# Patient Record
Sex: Female | Born: 1960 | Race: White | Hispanic: No | Marital: Single | Smoking: Never smoker
Health system: Southern US, Community
[De-identification: ages and names within clinical notes are randomized; demographics above are authoritative.]

## PROBLEM LIST (undated history)

## (undated) DIAGNOSIS — E669 Obesity, unspecified: Secondary | ICD-10-CM

## (undated) DIAGNOSIS — E785 Hyperlipidemia, unspecified: Secondary | ICD-10-CM

## (undated) DIAGNOSIS — I10 Essential (primary) hypertension: Secondary | ICD-10-CM

## (undated) DIAGNOSIS — I119 Hypertensive heart disease without heart failure: Secondary | ICD-10-CM

## (undated) DIAGNOSIS — I5032 Chronic diastolic (congestive) heart failure: Secondary | ICD-10-CM

## (undated) HISTORY — PX: HAND SURGERY: SHX662

## (undated) HISTORY — DX: Obesity, unspecified: E66.9

## (undated) HISTORY — DX: Chronic diastolic (congestive) heart failure: I50.32

## (undated) HISTORY — DX: Hypertensive heart disease without heart failure: I11.9

## (undated) HISTORY — DX: Hyperlipidemia, unspecified: E78.5

---

## 2005-10-20 ENCOUNTER — Ambulatory Visit: Payer: Self-pay | Admitting: Family Medicine

## 2005-12-24 ENCOUNTER — Observation Stay (HOSPITAL_COMMUNITY): Admission: EM | Admit: 2005-12-24 | Discharge: 2005-12-25 | Payer: Self-pay | Admitting: Emergency Medicine

## 2005-12-24 ENCOUNTER — Ambulatory Visit: Payer: Self-pay | Admitting: Cardiovascular Disease

## 2006-01-06 ENCOUNTER — Ambulatory Visit: Payer: Self-pay

## 2007-09-14 ENCOUNTER — Emergency Department: Payer: Self-pay | Admitting: Emergency Medicine

## 2008-01-19 ENCOUNTER — Ambulatory Visit: Payer: Self-pay | Admitting: Family Medicine

## 2009-01-23 ENCOUNTER — Ambulatory Visit: Payer: Self-pay | Admitting: Family Medicine

## 2009-03-20 ENCOUNTER — Ambulatory Visit: Payer: Self-pay | Admitting: Unknown Physician Specialty

## 2009-03-27 ENCOUNTER — Ambulatory Visit: Payer: Self-pay | Admitting: Unknown Physician Specialty

## 2009-12-10 ENCOUNTER — Emergency Department: Payer: Self-pay | Admitting: Emergency Medicine

## 2010-02-17 ENCOUNTER — Ambulatory Visit: Payer: Self-pay | Admitting: Family Medicine

## 2011-03-24 LAB — BASIC METABOLIC PANEL
BUN: 11 mg/dL (ref 7–18)
Chloride: 106 mmol/L (ref 98–107)
Creatinine: 0.87 mg/dL (ref 0.60–1.30)
EGFR (Non-African Amer.): >60
Potassium: 3.6 mmol/L (ref 3.5–5.1)

## 2011-03-24 LAB — CBC
HGB: 12.5 g/dL (ref 12.0–16.0)
MCH: 28.7 pg (ref 26.0–34.0)
MCHC: 33.1 g/dL (ref 32.0–36.0)
MCV: 87 fL (ref 80–100)
RBC: 4.37 10*6/uL (ref 3.80–5.20)

## 2011-03-25 ENCOUNTER — Observation Stay: Payer: Self-pay | Admitting: Internal Medicine

## 2011-03-25 LAB — TROPONIN I
Troponin-I: 0.04 ng/mL
Troponin-I: 0.04 ng/mL

## 2011-03-25 LAB — CK TOTAL AND CKMB (NOT AT ARMC)
CK, Total: 86 U/L (ref 21–215)
CK-MB: 1.4 ng/mL (ref 0.5–3.6)

## 2014-03-17 ENCOUNTER — Emergency Department: Payer: Self-pay | Admitting: Emergency Medicine

## 2014-07-14 NOTE — H&P (Signed)
PATIENT NAME:  Mindy Myers, Mindy Myers MR#:  [id number] DATE OF BIRTH:  [birth year]  DATE OF ADMISSION:  03/25/2011  PRIMARY CARE PHYSICIAN: Dr. Burnett Sheng  EMERGENCY ROOM PHYSICIAN: Dr. Marilynne Halsted   CHIEF COMPLAINT: Chest pain.  HISTORY OF PRESENT ILLNESS: Patient is a 54 year old female presents with chief complaint of substernal chest pain. Symptoms began at 5:00 p.m. while patient was cooking dinner. Chest pain is sharp, constant, associated with shortness of breath. Patient denies any dizziness, diaphoresis. Chest pain radiated to the left arm. Patient noted that her blood pressure was elevated. She took her blood pressure medications; metoprolol, lisinopril and amlodipine. Patient denies any other aggravating or alleviating factors.   PAST MEDICAL HISTORY:  1. Hypertension. 2. Depression.   ALLERGIES: No known drug allergies.   CURRENT MEDICATIONS:  1. Sertraline 50 mg p.o. daily.  2. Metoprolol 100 mg p.o. b.i.d.  3. Lisinopril 40 mg p.o. b.i.d.  4. Amlodipine 10 mg p.o. daily.   SOCIAL HISTORY: Patient denies tobacco abuse, alcohol abuse or drug abuse. Patient works in [employer].    FAMILY HISTORY: Patient's family history is positive for coronary artery disease. Father died in his 74s, had a myocardial infarction. Mother died in her 4s, had a myocardial infarction.    REVIEW OF SYSTEMS: CONSTITUTIONAL: Patient denies any fevers, chills, night sweats. HEENT: Patient denies any hearing loss, dysphagia, visual problems, sore throat. CARDIOVASCULAR: Patient denies any orthopnea, PND, syncope. RESPIRATORY: Patient denies cough, wheezing, or hemoptysis. GASTROINTESTINAL: Patient denies any nausea, vomiting, abdominal pain, hematemesis, hematochezia or melena. GENITOURINARY: Patient denies any hematuria, dysuria or frequency. NEUROLOGIC: Patient denies any headache, focal weakness or seizures. SKIN: Patient denies any lesions, rash. ENDOCRINE: Patient denies any polyuria, polyphagia,  polydipsia. MUSCULOSKELETAL: Patient denies any arthritis, joint effusion, swelling. HEMATOLOGICAL: Patient denies any easy bleeding or bruises.   PHYSICAL EXAMINATION:  VITAL SIGNS: Temperature 96.6, heart rate 86, respirations 19, blood pressure 91/61, oxygen saturation 96%.   HEENT: Atraumatic, normocephalic. Pupils are equal, round, and reactive to light and accommodation. Extraocular movements are intact. Sclerae anicteric. Mucous membranes moist.   NECK: Supple. No organomegaly.    CARDIOVASCULAR: S1, S2, regular rate, rhythm. No gallops. No thrills. No murmurs.   RESPIRATORY: Lungs are clear to auscultation. No rales, no rhonchi, no wheezes, no bronchial breath sounds.   GASTROINTESTINAL: Abdomen is soft, nontender, nondistended. Normal bowel sounds. No hepatosplenomegaly.   GENITOURINARY: There is no hematuria or masses noted.   SKIN: No lesions. No rash.   ENDOCRINE: No masses. No thyromegaly.   LYMPH: No lymphadenopathy or nodes palpable.   NEUROLOGIC: Cranial nerves II through XII grossly intact. Motor strength is 5/5 bilateral upper and lower extremities. Sensation is within normal limits. No focal neurological deficit noted on examination.   MUSCULOSKELETAL: No arthritis, joint effusion, swelling.   HEMATOLOGICAL: No ecchymosis, no bleeding, no petechiae noted.   LABORATORY, DIAGNOSTIC, AND RADIOLOGICAL DATA:  Electrocardiogram shows normal sinus rhythm, 86 beats per minute, left ventricular hypertrophy present.   Chest x-ray is negative.   WBC count 9400, hemoglobin 12.5, hematocrit 37.9, platelet count 349, glucose 78, BUN 11, creatinine 0.87, sodium 141, potassium 3.6, chloride 106, CO2 26, calcium 8.8. Estimated GFR is greater than 60. Troponin 0.04.   ASSESSMENT AND PLAN:  1. Patient is a 54 year old female who presents with chief complaint of chest pain. Admit to telemetry. Start chest pain clinical pathway orders. Start aspirin. Continue metoprolol,  lisinopril. Check serial cardiac enzymes and troponin, echo. Cardiology consultation.  2. Depression. Continue sertraline.  ____________________________ Donia AstJignesh S. Kaysey Berndt, MD jsp:cms D: 03/25/2011 03:44:11 ET T: 03/25/2011 09:26:57 ET JOB#: [id number]  cc: Donia AstJignesh S. Jazlynne Milliner, MD, <Dictator> Rhona LeavensJames F. Burnett ShengHedrick, MD Donia AstJIGNESH S Kharee Lesesne MD ELECTRONICALLY SIGNED 03/26/2011 12:11

## 2014-07-14 NOTE — Discharge Summary (Signed)
PATIENT NAME:  Mindy Myers, Mindy Myers MR#:  [id number] DATE OF BIRTH:  07-17-1960  DATE OF ADMISSION:  03/25/2011 DATE OF DISCHARGE:  03/25/2011  DISCHARGE DIAGNOSES:  1. Atypical noncardiac chest pain, unclear etiology.    2. Hypertension.  3. Depression.   DISPOSITION: Patient is being discharged home.   FOLLOW UP: Follow up with primary care physician in 1 to 2 weeks after discharge.   DIET: Low sodium.   ACTIVITY: As tolerated.   DISCHARGE MEDICATIONS:  1. Lisinopril 40 mg b.i.d.  2. Amlodipine 10 mg daily. 3. Sertraline 50 mg daily.  4. Lopressor 100 mg b.i.d.   LABORATORY, DIAGNOSTIC AND RADIOLOGICAL DATA: Inpatient nuclear stress test showed no evidence of any ischemia. Chest x-ray showed no acute cardiopulmonary disease. CBC was normal. Serial cardiac enzymes negative. Complete metabolic panel essentially normal.   HOSPITAL COURSE: Patient is a 54 year old female with past medical history of hypertension and depression who presented with atypical right-sided chest pain with radiation into her right upper extremity. Given her risk factors she was admitted to the hospital. Acute coronary syndrome was ruled out with three sets of cardiac enzymes. She underwent an inpatient nuclear stress test which showed no evidence of ischemia. Patient is being discharged in stable condition. She will follow up with her primary care physician in 1 to 2 weeks after discharge.    TIME SPENT: 45 minutes.   ____________________________ Darrick MeigsSangeeta Lashundra Shiveley, MD sp:cms D: 03/25/2011 16:08:13 ET T: 03/29/2011 08:44:18 ET JOB#: [id number]  cc: Darrick MeigsSangeeta Ellianah Cordy, MD, <Dictator> Darrick MeigsSANGEETA Jahad Old MD ELECTRONICALLY SIGNED 03/29/2011 12:16

## 2015-11-01 ENCOUNTER — Emergency Department
Admission: EM | Admit: 2015-11-01 | Discharge: 2015-11-01 | Disposition: A | Discharge status: Home / Self Care | Payer: Self-pay | Attending: Emergency Medicine | Admitting: Emergency Medicine

## 2015-11-01 ENCOUNTER — Emergency Department: Payer: Self-pay

## 2015-11-01 ENCOUNTER — Encounter: Payer: Self-pay | Admitting: Emergency Medicine

## 2015-11-01 DIAGNOSIS — S60221A Contusion of right hand, initial encounter: Secondary | ICD-10-CM | POA: Insufficient documentation

## 2015-11-01 DIAGNOSIS — Y939 Activity, unspecified: Secondary | ICD-10-CM | POA: Insufficient documentation

## 2015-11-01 DIAGNOSIS — Y999 Unspecified external cause status: Secondary | ICD-10-CM | POA: Insufficient documentation

## 2015-11-01 DIAGNOSIS — W228XXA Striking against or struck by other objects, initial encounter: Secondary | ICD-10-CM | POA: Insufficient documentation

## 2015-11-01 DIAGNOSIS — I10 Essential (primary) hypertension: Secondary | ICD-10-CM | POA: Insufficient documentation

## 2015-11-01 DIAGNOSIS — Y929 Unspecified place or not applicable: Secondary | ICD-10-CM | POA: Insufficient documentation

## 2015-11-01 HISTORY — DX: Essential (primary) hypertension: I10

## 2015-11-01 MED ORDER — NAPROXEN 500 MG PO TABS: 500.0000 mg | ORAL_TABLET | Freq: Two times a day (BID) | ORAL | 0 refills | Status: DC

## 2015-11-01 NOTE — ED Triage Notes (Signed)
Pt reports hitting her right wrist on a metal pole prior to going to work states she was working and using that arm and pain has now turned to numbness in hand that radiates up to right elbow.

## 2015-11-01 NOTE — ED Provider Notes (Signed)
[facility] Emergency Department Provider Note ____________________________________________  Time seen: Approximately 6:42 PM  I have reviewed the triage vital signs and the nursing notes.   HISTORY  Chief Complaint Arm Pain    HPI Mindy Myers is a 55 y.o. female presents to the emergency department for evaluation of right hand pain. She states that today prior to going to work, she was using a Marine scientist and there was a metal pole under it. She states that her hand slipped and hit a metal pole. She states that it was mildly painful just after it happened. 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 She denies previous hand injury or fracture. Not taken anything for pain prior to arrival.  Past Medical History:  Diagnosis Date  . Hypertension     There are no active problems to display for this patient.   Past Surgical History:  Procedure Laterality Date  . HAND SURGERY Left     Prior to Admission medications   Medication Sig Start Date End Date Taking? Authorizing Provider  naproxen (NAPROSYN) 500 MG tablet Take 1 tablet (500 mg total) by mouth 2 (two) times daily with a meal. 11/01/15   Chinita Pester, FNP    Allergies Review of patient's allergies indicates no known allergies.  History reviewed. No pertinent family history.  Social History Social History  Substance Use Topics  . Smoking status: Never Smoker  . Smokeless tobacco: Never Used  . Alcohol use No    Review of Systems Constitutional: No recent illness. Cardiovascular: Denies chest pain or palpitations. Respiratory: Denies shortness of breath. Musculoskeletal: Pain in Right hand Skin: Negative for rash, wound, lesion. Neurological: Negative for focal weakness or numbness.  ____________________________________________   PHYSICAL EXAM:  VITAL SIGNS: ED Triage Vitals  Enc Vitals Group     BP 11/01/15 1824  (!) 179/102     Pulse Rate 11/01/15 1824 94     Resp 11/01/15 1824 18     Temp 11/01/15 1824 98.1 F (36.7 C)     Temp Source 11/01/15 1824 Oral     SpO2 11/01/15 1824 95 %     Weight 11/01/15 1828 270 lb (122.5 kg)     Height 11/01/15 1828  (1.753 m)     Head Circumference --      Peak Flow --      Pain Score 11/01/15 1828 8     Pain Loc --      Pain Edu? --      Excl. in GC? --     Constitutional: Alert and oriented. Well appearing and in no acute distress. Eyes: Conjunctivae are normal. EOMI. Head: Atraumatic. Neck: No stridor.  Respiratory: Normal respiratory effort.   Musculoskeletal: Limited extension of the MCP of the right thumb secondary to pain. There is a significant increase in the bony prominence of the lateral MCP. Neurologic:  Normal speech and language. No gross focal neurologic deficits are appreciated. Speech is normal. No gait instability. Skin:  Skin is warm, dry and intact. Atraumatic. Psychiatric: Mood and affect are normal. Speech and behavior are normal.  ____________________________________________   LABS (all labs ordered are listed, but only abnormal results are displayed)  Labs Reviewed - No data to display ____________________________________________  RADIOLOGY  No acute bony abnormality per radiology. There appears to be well calcified densities adjacent to the trapezium. ____________________________________________   PROCEDURES  Procedure(s) performed: Cockup  splint applied by ER tech.   ____________________________________________   INITIAL IMPRESSION / ASSESSMENT AND PLAN / ED COURSE  Pertinent labs & imaging results that were available during my care of the patient were reviewed by me and considered in my medical decision making (see chart for details).  A was encouraged to follow up with the orthopedist for symptoms that are not improving over the week. She was instructed to take the Naprosyn as prescribed. She was  instructed to return to the emergency department for symptoms that change or worsen if unable to schedule an appointment. ____________________________________________   FINAL CLINICAL IMPRESSION(S) / ED DIAGNOSES  Final diagnoses:  Contusion, hand, right, initial encounter       Chinita PesterCari B Adam Demary, FNP 11/01/15 2003    Emily FilbertJonathan E Williams, MD 11/01/15 2259

## 2016-08-18 ENCOUNTER — Emergency Department: Payer: Self-pay

## 2016-08-18 ENCOUNTER — Emergency Department
Admission: EM | Admit: 2016-08-18 | Discharge: 2016-08-18 | Disposition: A | Discharge status: Home / Self Care | Payer: Self-pay | Attending: Student in an Organized Health Care Education/Training Program | Admitting: Student in an Organized Health Care Education/Training Program

## 2016-08-18 DIAGNOSIS — T782XXA Anaphylactic shock, unspecified, initial encounter: Secondary | ICD-10-CM | POA: Insufficient documentation

## 2016-08-18 DIAGNOSIS — I10 Essential (primary) hypertension: Secondary | ICD-10-CM | POA: Insufficient documentation

## 2016-08-18 MED ORDER — EPINEPHRINE 0.3 MG/0.3ML IJ SOAJ: 0.3000 mg | Freq: Once | INTRAMUSCULAR | 0 refills | Status: AC

## 2016-08-18 MED ORDER — ALBUTEROL SULFATE (2.5 MG/3ML) 0.083% IN NEBU
5.0000 mg | INHALATION_SOLUTION | Freq: Once | RESPIRATORY_TRACT | Status: AC
  Administered 2016-08-18: 5 mg via RESPIRATORY_TRACT
  Filled 2016-08-18: qty 6

## 2016-08-18 MED ORDER — DIPHENHYDRAMINE HCL 50 MG/ML IJ SOLN
25.0000 mg | Freq: Once | INTRAMUSCULAR | Status: AC
  Administered 2016-08-18: 25 mg via INTRAVENOUS

## 2016-08-18 MED ORDER — METOPROLOL TARTRATE 25 MG PO TABS
25.0000 mg | ORAL_TABLET | Freq: Once | ORAL | Status: AC
  Administered 2016-08-18: 25 mg via ORAL
  Filled 2016-08-18: qty 1

## 2016-08-18 MED ORDER — METHYLPREDNISOLONE SODIUM SUCC 125 MG IJ SOLR
INTRAMUSCULAR | Status: AC
  Filled 2016-08-18: qty 2

## 2016-08-18 MED ORDER — EPINEPHRINE 0.3 MG/0.3ML IJ SOAJ
0.3000 mg | Freq: Once | INTRAMUSCULAR | Status: AC
  Administered 2016-08-18: 0.3 mg via INTRAMUSCULAR
  Filled 2016-08-18: qty 0.3

## 2016-08-18 MED ORDER — PREDNISONE 10 MG PO TABS: 10.0000 mg | ORAL_TABLET | Freq: Every day | ORAL | 0 refills | Status: DC

## 2016-08-18 MED ORDER — FAMOTIDINE IN NACL 20-0.9 MG/50ML-% IV SOLN
INTRAVENOUS | Status: AC
  Filled 2016-08-18: qty 50

## 2016-08-18 MED ORDER — METOPROLOL TARTRATE 25 MG PO TABS: 25.0000 mg | ORAL_TABLET | Freq: Two times a day (BID) | ORAL | 0 refills | Status: DC

## 2016-08-18 MED ORDER — FAMOTIDINE IN NACL 20-0.9 MG/50ML-% IV SOLN
20.0000 mg | Freq: Once | INTRAVENOUS | Status: AC
  Administered 2016-08-18: 20 mg via INTRAVENOUS

## 2016-08-18 MED ORDER — METHYLPREDNISOLONE SODIUM SUCC 125 MG IJ SOLR
60.0000 mg | Freq: Once | INTRAMUSCULAR | Status: AC
  Administered 2016-08-18: 60 mg via INTRAVENOUS

## 2016-08-18 MED ORDER — DIPHENHYDRAMINE HCL 50 MG/ML IJ SOLN
INTRAMUSCULAR | Status: AC
  Filled 2016-08-18: qty 1

## 2016-08-18 NOTE — ED Provider Notes (Signed)
[facility] Emergency Department Provider Note    None    (approximate)  I have reviewed the triage vital signs and the nursing notes.   HISTORY  Chief Complaint Allergic Reaction    HPI Mindy Myers is a 56 y.o. female presents with chief complaint of swelling to eyes reddening of her scan and itching discomfort around her neck feeling that her throat was tightening starting today. Note patient contracted poison ivy to the right forearm earlier this week. Has been spitting wartime working out in the garden. States that today it suddenly became worse associated with shortness of breath. Does have a history of allergies but is never had allergies to severe. Has not taken any medications prior to arrival. She denies any chest pain.   Past Medical History:  Diagnosis Date  . Hypertension    No family history on file. Past Surgical History:  Procedure Laterality Date  . HAND SURGERY Left    There are no active problems to display for this patient.     Prior to Admission medications   Medication Sig Start Date End Date Taking? Authorizing Provider  EPINEPHrine (EPIPEN 2-PAK) 0.3 mg/0.3 mL IJ SOAJ injection Inject 0.3 mLs (0.3 mg total) into the muscle once. 08/18/16 08/18/16  Willy Eddyobinson, Catherine Cubero, MD  naproxen (NAPROSYN) 500 MG tablet Take 1 tablet (500 mg total) by mouth 2 (two) times daily with a meal. 11/01/15   Triplett, Cari B, FNP  predniSONE (DELTASONE) 10 MG tablet Take 1 tablet (10 mg total) by mouth daily. Day 1-2: Take 50 mg  ( 5 pills) Day 3-4 : Take 40 mg (4pills) Day 5-6: Take 30 mg (3 pills) Day 7-8:  Take 20 mg (2 pills) Day 9:  Take 10mg  (1 pill) 08/18/16   Willy Eddyobinson, Sheritha Louis, MD    Allergies Patient has no known allergies.    Social History Social History  Substance Use Topics  . Smoking status: Never Smoker  . Smokeless tobacco: Never Used  . Alcohol use No    Review of Systems Patient denies headaches, rhinorrhea, blurry  vision, numbness, shortness of breath, chest pain, edema, cough, abdominal pain, nausea, vomiting, diarrhea, dysuria, fevers, rashes or hallucinations unless otherwise stated above in HPI. ____________________________________________   PHYSICAL EXAM:  VITAL SIGNS: Vitals:   08/18/16 1930 08/18/16 2000  BP: (!) 175/75 (!) 158/74  Pulse: 93 (!) 108  Resp: 16 (!) 24  Temp:      Constitutional: Alert and oriented. Well appearing and in no acute distress. Eyes: Conjunctivae are normal.  Periorbital edema bilaterally Head: Atraumatic. Nose: No congestion/rhinnorhea. Mouth/Throat: Mucous membranes are moist. + uvular edema  Neck: No stridor. Painless ROM.  Cardiovascular: Normal rate, regular rhythm. Grossly normal heart sounds.  Good peripheral circulation. Respiratory: Normal respiratory effort.  No retractions. Lungs with expiratory wheezing  Gastrointestinal: Soft and nontender. No distention. No abdominal bruits. No CVA tenderness. Genitourinary:  Musculoskeletal: No lower extremity tenderness nor edema.  No joint effusions. Neurologic:  Normal speech and language. No gross focal neurologic deficits are appreciated. No facial droop Skin:  Skin is warm, dry and intact. Diffuse urticarial rash to neck and BUE Psychiatric: Mood and affect are normal. Speech and behavior are normal.  ____________________________________________   LABS (all labs ordered are listed, but only abnormal results are displayed)  No results found for this or any previous visit (from the past 24 hour(s)). ____________________________________________  EKG My review and personal interpretation at Time: 19:17   Indication: allergic reaction  Rate: 95  Rhythm: sinus Axis: normal Other: non specific st changs, no STEMI ____________________________________________  RADIOLOGY  I personally reviewed all radiographic images ordered to evaluate for the above acute complaints and reviewed radiology reports and  findings.  These findings were personally discussed with the patient.  Please see medical record for radiology report.  ____________________________________________   PROCEDURES  Procedure(s) performed:  Procedures    Critical Care performed: no ____________________________________________   INITIAL IMPRESSION / ASSESSMENT AND PLAN / ED COURSE  Pertinent labs & imaging results that were available during my care of the patient were reviewed by me and considered in my medical decision making (see chart for details).  DDX: anaphylaxis, allergic reaction, urticaria, dermatitis  Mindy Myers is a 56 y.o. who presents to the ED with above symptoms. Presentation concerning for acute allergic reaction and anaphylaxis based on this exam.  She is otherwise afebrile and nontoxic-appearing. She is currently protecting her airway. EpiPen given. Will give steroids as well as H1 and H2 blockers and reassess. Seems less consistent with congestive heart failure, COPD, pneumonia or ACS.  Clinical Course as of Aug 19 2034  Wed Aug 18, 2016  1914 Patient reassessed. Urticaria markedly improved and states shortness of breath is also improved. We'll continue to monitor.  [PR]    Clinical Course User Index [PR] Willy Eddy, MD   ----------------------------------------- 8:35 PM on 08/18/2016 -----------------------------------------  Patient's symptoms continue to improve. Patient will be observed for a total of 3 hours which would be at 9:30 to evaluate for any rebound allergic reaction. The patient will be signed out to Dr. Sharma Covert pending completion of observation period.  Have discussed with the patient and available family all diagnostics and treatments performed thus far and all questions were answered to the best of my ability. The patient demonstrates understanding and agreement with plan.   ____________________________________________   FINAL CLINICAL IMPRESSION(S) / ED  DIAGNOSES  Final diagnoses:  Anaphylaxis, initial encounter      NEW MEDICATIONS STARTED DURING THIS VISIT:  New Prescriptions   EPINEPHRINE (EPIPEN 2-PAK) 0.3 MG/0.3 ML IJ SOAJ INJECTION    Inject 0.3 mLs (0.3 mg total) into the muscle once.   PREDNISONE (DELTASONE) 10 MG TABLET    Take 1 tablet (10 mg total) by mouth daily. Day 1-2: Take 50 mg  ( 5 pills) Day 3-4 : Take 40 mg (4pills) Day 5-6: Take 30 mg (3 pills) Day 7-8:  Take 20 mg (2 pills) Day 9:  Take 10mg  (1 pill)     Note:  This document was prepared using Dragon voice recognition software and may include unintentional dictation errors.    Willy Eddy, MD 08/18/16 2036

## 2016-08-18 NOTE — ED Notes (Signed)
Pt denies CP. Pt reports a decr in SOB.

## 2016-08-18 NOTE — ED Notes (Signed)
See triage note..the patient c/o rash to chest, arms and face.  Pt sts that she has had rash/"poison ivy" for 1 week. Sts that she took benadryl yesterday w/o relief.  Pt sts that she began to having swell/redness around eyes and difficulty breathing today. Pt AA/OX3, resp even and unlabored, able to speak in complete sentence w/o issue. Lung sounds clear. MAE.  NAD.

## 2016-08-18 NOTE — ED Triage Notes (Signed)
Pt has poison ivy to right forearm X 1 week, itching rash, swelling to eyes and reddened skin since Sunday. Pt reports trouble breathing, no medications taken PTA.

## 2016-08-18 NOTE — ED Provider Notes (Signed)
This patient was signed out to me by Dr. Willy EddyPatrick Robinson. 56 year old female with an allergic reaction with urticaria and some shortness of breath he was given epinephrine upon arrival. It has now been greater than 3 hours since she was given epinephrine and she is mildly tachycardic and hypertensive, but states that she has not taken any of her 3 blood pressure medications over the last month due to not being able to afford them. She states her urticaria has significantly improved, and she is no longer feeling short of breath. I have re-auscultated her, and she has no wheezing or abnormal lung sounds. She is not drooling, her voice is normal and she does not have a hoarse voice or hot potato voice. I will plan to give her metoprolol for her hypertension and tachycardia, which is likely partially driven by epinephrine, and then discharge her home. She has prescriptions written by Dr. Roxan Hockeyobinson for her allergic reaction, and in addition I have given her prescription for metoprolol for home use, as well as given her the contact information for the local clinics to will see patients without insurance. The patient is stable for discharge and understands return precautions which I discussed with her myself.   Rockne MenghiniNorman, Anne-Caroline, MD 08/18/16 2144

## 2017-03-27 ENCOUNTER — Emergency Department: Payer: Self-pay

## 2017-03-27 ENCOUNTER — Inpatient Hospital Stay
Admission: EM | Admit: 2017-03-27 | Discharge: 2017-03-29 | DRG: 292 | Disposition: A | Discharge status: Home / Self Care | Payer: Self-pay | Attending: Internal Medicine | Admitting: Internal Medicine

## 2017-03-27 ENCOUNTER — Encounter: Payer: Self-pay | Admitting: Emergency Medicine

## 2017-03-27 ENCOUNTER — Other Ambulatory Visit: Payer: Self-pay

## 2017-03-27 DIAGNOSIS — Z6836 Body mass index (BMI) 36.0-36.9, adult: Secondary | ICD-10-CM

## 2017-03-27 DIAGNOSIS — I110 Hypertensive heart disease with heart failure: Principal | ICD-10-CM | POA: Diagnosis present

## 2017-03-27 DIAGNOSIS — I5041 Acute combined systolic (congestive) and diastolic (congestive) heart failure: Secondary | ICD-10-CM | POA: Diagnosis present

## 2017-03-27 DIAGNOSIS — R079 Chest pain, unspecified: Secondary | ICD-10-CM

## 2017-03-27 DIAGNOSIS — I509 Heart failure, unspecified: Secondary | ICD-10-CM

## 2017-03-27 DIAGNOSIS — N390 Urinary tract infection, site not specified: Secondary | ICD-10-CM | POA: Diagnosis present

## 2017-03-27 DIAGNOSIS — J810 Acute pulmonary edema: Secondary | ICD-10-CM

## 2017-03-27 DIAGNOSIS — I10 Essential (primary) hypertension: Secondary | ICD-10-CM

## 2017-03-27 DIAGNOSIS — I161 Hypertensive emergency: Secondary | ICD-10-CM | POA: Diagnosis present

## 2017-03-27 DIAGNOSIS — Z79899 Other long term (current) drug therapy: Secondary | ICD-10-CM

## 2017-03-27 DIAGNOSIS — E876 Hypokalemia: Secondary | ICD-10-CM | POA: Diagnosis present

## 2017-03-27 HISTORY — DX: Heart failure, unspecified: I50.9

## 2017-03-27 LAB — CBC
HEMATOCRIT: 42.2 % (ref 35.0–47.0)
HEMOGLOBIN: 13.9 g/dL (ref 12.0–16.0)
MCH: 28.6 pg (ref 26.0–34.0)
MCHC: 32.9 g/dL (ref 32.0–36.0)
MCV: 86.9 fL (ref 80.0–100.0)
Platelets: 394 10*3/uL (ref 150–440)
RBC: 4.86 MIL/uL (ref 3.80–5.20)
RDW: 13.8 % (ref 11.5–14.5)
WBC: 9.1 10*3/uL (ref 3.6–11.0)

## 2017-03-27 LAB — BASIC METABOLIC PANEL
ANION GAP: 11 (ref 5–15)
BUN: 14 mg/dL (ref 6–20)
CALCIUM: 8.8 mg/dL — AB (ref 8.9–10.3)
CO2: 25 mmol/L (ref 22–32)
Chloride: 104 mmol/L (ref 101–111)
Creatinine, Ser: 0.69 mg/dL (ref 0.44–1.00)
GFR calc Af Amer: >60 mL/min (ref >60)
GLUCOSE: 109 mg/dL — AB (ref 65–99)
POTASSIUM: 4 mmol/L (ref 3.5–5.1)
Sodium: 140 mmol/L (ref 135–145)

## 2017-03-27 LAB — BRAIN NATRIURETIC PEPTIDE: B NATRIURETIC PEPTIDE 5: 83 pg/mL (ref 0.0–100.0)

## 2017-03-27 LAB — TROPONIN I
Troponin I: <0.03 ng/mL (ref <0.03)
Troponin I: <0.03 ng/mL (ref <0.03)

## 2017-03-27 MED ORDER — ONDANSETRON HCL 4 MG/2ML IJ SOLN: 4.0000 mg | Freq: Four times a day (QID) | INTRAMUSCULAR | Status: DC | PRN

## 2017-03-27 MED ORDER — CARVEDILOL 6.25 MG PO TABS: 6.2500 mg | ORAL_TABLET | Freq: Two times a day (BID) | ORAL | Status: DC

## 2017-03-27 MED ORDER — ACETAMINOPHEN 325 MG PO TABS: 650.0000 mg | ORAL_TABLET | ORAL | Status: DC | PRN

## 2017-03-27 MED ORDER — ENOXAPARIN SODIUM 40 MG/0.4ML ~~LOC~~ SOLN
40.0000 mg | SUBCUTANEOUS | Status: DC
  Administered 2017-03-28: 40 mg via SUBCUTANEOUS
  Filled 2017-03-27: qty 0.4

## 2017-03-27 MED ORDER — NITROGLYCERIN 2 % TD OINT
1.0000 [in_us] | TOPICAL_OINTMENT | Freq: Once | TRANSDERMAL | Status: AC
  Administered 2017-03-27: 1 [in_us] via TOPICAL
  Filled 2017-03-27: qty 1

## 2017-03-27 MED ORDER — SODIUM CHLORIDE 0.9% FLUSH
3.0000 mL | Freq: Two times a day (BID) | INTRAVENOUS | Status: DC
  Administered 2017-03-27 – 2017-03-29 (×4): 3 mL via INTRAVENOUS

## 2017-03-27 MED ORDER — FUROSEMIDE 10 MG/ML IJ SOLN
40.0000 mg | Freq: Two times a day (BID) | INTRAMUSCULAR | Status: DC
  Administered 2017-03-27 – 2017-03-28 (×3): 40 mg via INTRAVENOUS
  Filled 2017-03-27 (×3): qty 4

## 2017-03-27 MED ORDER — ASPIRIN EC 81 MG PO TBEC
81.0000 mg | DELAYED_RELEASE_TABLET | Freq: Every day | ORAL | Status: DC
  Administered 2017-03-28 – 2017-03-29 (×2): 81 mg via ORAL
  Filled 2017-03-27 (×2): qty 1

## 2017-03-27 MED ORDER — SODIUM CHLORIDE 0.9 % IV SOLN: 250.0000 mL | INTRAVENOUS | Status: DC | PRN

## 2017-03-27 MED ORDER — IRBESARTAN 150 MG PO TABS
150.0000 mg | ORAL_TABLET | Freq: Every day | ORAL | Status: DC
  Administered 2017-03-28: 150 mg via ORAL
  Filled 2017-03-27: qty 1

## 2017-03-27 MED ORDER — CARVEDILOL 6.25 MG PO TABS
6.2500 mg | ORAL_TABLET | Freq: Once | ORAL | Status: AC
  Administered 2017-03-27: 6.25 mg via ORAL
  Filled 2017-03-27: qty 1

## 2017-03-27 MED ORDER — NITROFURANTOIN MONOHYD MACRO 100 MG PO CAPS
100.0000 mg | ORAL_CAPSULE | Freq: Two times a day (BID) | ORAL | Status: DC
  Administered 2017-03-28 – 2017-03-29 (×3): 100 mg via ORAL
  Filled 2017-03-27 (×5): qty 1

## 2017-03-27 MED ORDER — SODIUM CHLORIDE 0.9% FLUSH: 3.0000 mL | INTRAVENOUS | Status: DC | PRN

## 2017-03-27 NOTE — ED Notes (Signed)
Pt given food tray and gingerale 

## 2017-03-27 NOTE — ED Triage Notes (Signed)
Pt to ED via POV c/o chest pain. Pt states that the pain started this morning and lasted for about 15-20 minutes. Pt states that the pain went away but came back about 1 hour PTA. Pt states that the pain is located in the center of her chest. Pt denies radiation of pain. Pt states that she having mild shortness of breath. Pt denies N/V. Pt has been dizzy. Pt denies diaphoresis.

## 2017-03-27 NOTE — ED Notes (Signed)
Pt upset that she has been in the room since 1800. Explained dr and I were in a procedure across the hall the last 50 minutes. Pt requesting something drink. Advised she needs to be seen by dr first. Given warm blanket

## 2017-03-27 NOTE — ED Notes (Signed)
Spoke with admitting dr - she wants me to release the coreg for pt's bp. Coreg released but not in our pyxis - floor made aware.

## 2017-03-27 NOTE — ED Provider Notes (Signed)
[facility] Emergency Department Provider Note  ____________________________________________   First MD Initiated Contact with Patient 03/27/17 1800     (approximate)  I have reviewed the triage vital signs and the nursing notes.   HISTORY  Chief Complaint Chest Pain   HPI Mindy Myers is a 57 y.o. female with a history of hypertension who is presenting to the emergency department with chest pain.  Says the pain is a pressure type pain to the center of her chest which was nonradiating.  She first had it this morning while shopping in the supermarket and lasted about 15-20 minutes.  She is denying any shortness of breath.  Says that she felt lightheaded at the time of the chest pain as well as slightly nauseous and flushed.  She said that the chest pain then abated completely but then returned several times throughout the afternoon so she reported to the emergency department.  Says that she was just started on metoprolol this past Friday.  Denies any worsening of the pain with exertion.  Denies any relieving factors.  Says that she has an extensive cardiac history in her family with multiple relatives having coronary artery disease.  Past Medical History:  Diagnosis Date  . Hypertension     There are no active problems to display for this patient.   Past Surgical History:  Procedure Laterality Date  . HAND SURGERY Left     Prior to Admission medications   Medication Sig Start Date End Date Taking? Authorizing Provider  metoprolol tartrate (LOPRESSOR) 25 MG tablet Take 1 tablet (25 mg total) by mouth 2 (two) times daily. 08/18/16 08/18/17  Rockne Menghini, MD  naproxen (NAPROSYN) 500 MG tablet Take 1 tablet (500 mg total) by mouth 2 (two) times daily with a meal. 11/01/15   Triplett, Cari B, FNP  predniSONE (DELTASONE) 10 MG tablet Take 1 tablet (10 mg total) by mouth daily. Day 1-2: Take 50 mg  ( 5 pills) Day 3-4 : Take 40 mg (4pills) Day 5-6: Take  30 mg (3 pills) Day 7-8:  Take 20 mg (2 pills) Day 9:  Take 10mg  (1 pill) 08/18/16   Willy Eddy, MD    Allergies Patient has no known allergies.  No family history on file.  Social History Social History   Tobacco Use  . Smoking status: Never Smoker  . Smokeless tobacco: Never Used  Substance Use Topics  . Alcohol use: No  . Drug use: No    Review of Systems  Constitutional: No fever/chills Eyes: No visual changes. ENT: No sore throat. Cardiovascular: As above Respiratory: Denies shortness of breath. Gastrointestinal: No abdominal pain.  No nausea, no vomiting.  No diarrhea.  No constipation. Genitourinary: Negative for dysuria. Musculoskeletal: Negative for back pain. Skin: Negative for rash. Neurological: Negative for headaches, focal weakness or numbness.   ____________________________________________   PHYSICAL EXAM:  VITAL SIGNS: ED Triage Vitals  Enc Vitals Group     BP 03/27/17 1738 (!) 202/86     Pulse Rate 03/27/17 1738 72     Resp 03/27/17 1738 16     Temp 03/27/17 1738 (!) 97.5 F (36.4 C)     Temp Source 03/27/17 1738 Oral     SpO2 03/27/17 1738 100 %     Weight 03/27/17 1738 270 lb (122.5 kg)     Height 03/27/17 1738 5\' 9"  (1.753 m)     Head Circumference --      Peak Flow --  Pain Score 03/27/17 1753 8     Pain Loc --      Pain Edu? --      Excl. in GC? --     Constitutional: Alert and oriented. Well appearing and in no acute distress. Eyes: Conjunctivae are normal.  Head: Atraumatic. Nose: No congestion/rhinnorhea. Mouth/Throat: Mucous membranes are moist.  Neck: No stridor.   Cardiovascular: Normal rate, regular rhythm. Grossly normal heart sounds.   Respiratory: Normal respiratory effort.  No retractions. Lungs CTAB. Gastrointestinal: Soft and nontender. No distention.  Musculoskeletal: No lower extremity tenderness nor edema.  No joint effusions. Neurologic:  Normal speech and language. No gross focal neurologic  deficits are appreciated. Skin:  Skin is warm, dry and intact. No rash noted. Psychiatric: Mood and affect are normal. Speech and behavior are normal.  ____________________________________________   LABS (all labs ordered are listed, but only abnormal results are displayed)  Labs Reviewed  BASIC METABOLIC PANEL - Abnormal; Notable for the following components:      Result Value   Glucose, Bld 109 (*)    Calcium 8.8 (*)    All other components within normal limits  CBC  TROPONIN I  TROPONIN I   ____________________________________________  EKG  ED ECG REPORT I, Arelia Longestavid M Gavriella Hearst, the attending physician, personally viewed and interpreted this ECG.   Date: 03/27/2017  EKG Time: 1755  Rate: 63  Rhythm: normal sinus rhythm  Axis: Normal  Intervals:none  ST&T Change: No ST segment elevation or depression.  T wave inversion in lead I as well as aVL.  Changes consistent with LVH.  T wave changes are new from previous.  ____________________________________________  RADIOLOGY  Mild diffuse interstitial prominence more likely reflecting mild interstitial edema and or bronchitic change ____________________________________________   PROCEDURES  Procedure(s) performed:   Procedures  Critical Care performed:   ____________________________________________   INITIAL IMPRESSION / ASSESSMENT AND PLAN / ED COURSE  Pertinent labs & imaging results that were available during my care of the patient were reviewed by me and considered in my medical decision making (see chart for details).  Differential diagnosis includes, but is not limited to, ACS, aortic dissection, pulmonary embolism, cardiac tamponade, pneumothorax, pneumonia, pericarditis, myocarditis, GI-related causes including esophagitis/gastritis, and musculoskeletal chest wall pain.   As part of my medical decision making, I reviewed the following data within the electronic MEDICAL RECORD NUMBER Notes from prior ED  visits  ----------------------------------------- 8:15 PM on 03/27/2017 -----------------------------------------  Patient with uncontrolled blood pressure and chest pain and signs of pulmonary edema.  Patient to be admitted to the hospital.  [facility]  Dr. Allena KatzPatel is requesting an inch of Nitropaste on the patient's chest.  The patient is understanding of the diagnosis as well as the treatment plan willing to comply.        ____________________________________________   FINAL CLINICAL IMPRESSION(S) / ED DIAGNOSES  Chest pain.  Uncontrolled hypertension.  Pulmonary edema.    NEW MEDICATIONS STARTED DURING THIS VISIT:  This SmartLink is deprecated. Use AVSMEDLIST instead to display the medication list for a patient.   Note:  This document was prepared using Dragon voice recognition software and may include unintentional dictation errors.     Myrna BlazerSchaevitz, Aiyana Stegmann Matthew, MD 03/27/17 2015

## 2017-03-28 ENCOUNTER — Inpatient Hospital Stay (HOSPITAL_COMMUNITY)
Admit: 2017-03-28 | Discharge: 2017-03-28 | Disposition: A | Discharge status: Home / Self Care | Payer: Self-pay | Attending: Internal Medicine | Admitting: Internal Medicine

## 2017-03-28 ENCOUNTER — Encounter: Payer: Self-pay | Admitting: Physician Assistant

## 2017-03-28 ENCOUNTER — Inpatient Hospital Stay (HOSPITAL_BASED_OUTPATIENT_CLINIC_OR_DEPARTMENT_OTHER): Payer: Self-pay

## 2017-03-28 DIAGNOSIS — I5030 Unspecified diastolic (congestive) heart failure: Secondary | ICD-10-CM

## 2017-03-28 DIAGNOSIS — I5041 Acute combined systolic (congestive) and diastolic (congestive) heart failure: Secondary | ICD-10-CM

## 2017-03-28 LAB — HEMOGLOBIN A1C
HEMOGLOBIN A1C: 5.4 % (ref 4.8–5.6)
Mean Plasma Glucose: 108.28 mg/dL

## 2017-03-28 LAB — NM MYOCAR MULTI W/SPECT W/WALL MOTION / EF
CHL CUP RESTING HR STRESS: 76 {beats}/min
CHL CUP STRESS STAGE 1 SPEED: 0 mph
CHL CUP STRESS STAGE 2 GRADE: 0 %
CHL CUP STRESS STAGE 2 HR: 83 {beats}/min
CHL CUP STRESS STAGE 2 SPEED: 0 mph
CHL CUP STRESS STAGE 3 HR: 105 {beats}/min
CHL CUP STRESS STAGE 4 GRADE: 0 %
CHL CUP STRESS STAGE 4 SPEED: 0 mph
CSEPEDS: 0 s
CSEPEW: 1 METS
CSEPHR: 67 %
Exercise duration (min): 1 min
LV sys vol: 62 mL
LVDIAVOL: 143 mL (ref 46–106)
MPHR: 162 {beats}/min
Peak HR: 105 {beats}/min
Percent of predicted max HR: 64 %
SDS: 6
SRS: 12
SSS: 18
Stage 1 Grade: 0 %
Stage 1 HR: 84 {beats}/min
Stage 3 Grade: 0 %
Stage 3 Speed: 0 mph
Stage 4 DBP: 93 mmHg
Stage 4 HR: 96 {beats}/min
Stage 4 SBP: 172 mmHg
TID: 1.02

## 2017-03-28 LAB — BASIC METABOLIC PANEL
ANION GAP: 8 (ref 5–15)
BUN: 12 mg/dL (ref 6–20)
CHLORIDE: 102 mmol/L (ref 101–111)
CO2: 30 mmol/L (ref 22–32)
Calcium: 8.5 mg/dL — ABNORMAL LOW (ref 8.9–10.3)
Creatinine, Ser: 0.69 mg/dL (ref 0.44–1.00)
GFR calc Af Amer: >60 mL/min (ref >60)
GLUCOSE: 120 mg/dL — AB (ref 65–99)
POTASSIUM: 3.4 mmol/L — AB (ref 3.5–5.1)
SODIUM: 140 mmol/L (ref 135–145)

## 2017-03-28 LAB — TSH: TSH: 2.451 u[IU]/mL (ref 0.350–4.500)

## 2017-03-28 LAB — MAGNESIUM: MAGNESIUM: 1.8 mg/dL (ref 1.7–2.4)

## 2017-03-28 LAB — ECHOCARDIOGRAM COMPLETE
Height: 69 in
WEIGHTICAEL: 3915.2 [oz_av]

## 2017-03-28 MED ORDER — TECHNETIUM TC 99M TETROFOSMIN IV KIT
32.0600 | PACK | Freq: Once | INTRAVENOUS | Status: AC | PRN
  Administered 2017-03-28: 32.06 via INTRAVENOUS

## 2017-03-28 MED ORDER — TECHNETIUM TC 99M TETROFOSMIN IV KIT
13.1160 | PACK | Freq: Once | INTRAVENOUS | Status: AC | PRN
  Administered 2017-03-28: 13.116 via INTRAVENOUS

## 2017-03-28 MED ORDER — POTASSIUM CHLORIDE CRYS ER 20 MEQ PO TBCR
40.0000 meq | EXTENDED_RELEASE_TABLET | Freq: Once | ORAL | Status: AC
  Administered 2017-03-28: 40 meq via ORAL
  Filled 2017-03-28: qty 2

## 2017-03-28 MED ORDER — CARVEDILOL 12.5 MG PO TABS
12.5000 mg | ORAL_TABLET | Freq: Two times a day (BID) | ORAL | Status: DC
  Administered 2017-03-28 – 2017-03-29 (×2): 12.5 mg via ORAL
  Filled 2017-03-28 (×2): qty 1

## 2017-03-28 MED ORDER — CARVEDILOL 6.25 MG PO TABS: 6.2500 mg | ORAL_TABLET | Freq: Once | ORAL | Status: DC

## 2017-03-28 MED ORDER — REGADENOSON 0.4 MG/5ML IV SOLN
0.4000 mg | Freq: Once | INTRAVENOUS | Status: AC
  Administered 2017-03-28: 0.4 mg via INTRAVENOUS

## 2017-03-28 MED ORDER — HYDRALAZINE HCL 20 MG/ML IJ SOLN: 10.0000 mg | Freq: Four times a day (QID) | INTRAMUSCULAR | Status: DC | PRN

## 2017-03-28 NOTE — Progress Notes (Signed)
[employer] - [place] [facility]   PATIENT NAME: Mindy Myers    MR#:  [id number]  DATE OF BIRTH:  09-03-1960  SUBJECTIVE:  CHIEF COMPLAINT:   Chief Complaint  Patient presents with  . Chest Pain   Better shortness of breath. REVIEW OF SYSTEMS:  Review of Systems  Constitutional: Negative for chills, fever and malaise/fatigue.  HENT: Negative for sore throat.   Eyes: Negative for blurred vision and double vision.  Respiratory: Positive for shortness of breath. Negative for cough, hemoptysis, wheezing and stridor.   Cardiovascular: Negative for chest pain, palpitations, orthopnea and leg swelling.  Gastrointestinal: Negative for abdominal pain, blood in stool, diarrhea, melena, nausea and vomiting.  Genitourinary: Negative for dysuria, flank pain and hematuria.  Musculoskeletal: Negative for back pain and joint pain.  Skin: Negative for rash.  Neurological: Negative for dizziness, sensory change, focal weakness, seizures, loss of consciousness, weakness and headaches.  Endo/Heme/Allergies: Negative for polydipsia.  Psychiatric/Behavioral: Negative for depression. The patient is not nervous/anxious.     DRUG ALLERGIES:  No Known Allergies VITALS:  Blood pressure (!) 178/81, pulse 70, temperature 98 F (36.7 C), temperature source Oral, resp. rate 18, height 5\' 9"  (1.753 m), weight 244 lb 11.2 oz (111 kg), SpO2 97 %. PHYSICAL EXAMINATION:  Physical Exam  Constitutional: She is oriented to person, place, and time and well-developed, well-nourished, and in no distress.  Morbid obesity.  HENT:  Head: Normocephalic.  Mouth/Throat: Oropharynx is clear and moist.  Eyes: Conjunctivae and EOM are normal. Pupils are equal, round, and reactive to light. No scleral icterus.  Neck: Normal range of motion. Neck supple. No JVD present. No tracheal deviation present.  Cardiovascular: Normal rate, regular rhythm and normal heart sounds. Exam reveals no gallop.  No  murmur heard. Pulmonary/Chest: Effort normal and breath sounds normal. No respiratory distress. She has no wheezes. She has no rales.  Abdominal: Soft. Bowel sounds are normal. She exhibits no distension. There is no tenderness. There is no rebound.  Musculoskeletal: Normal range of motion. She exhibits no edema or tenderness.  Neurological: She is alert and oriented to person, place, and time. No cranial nerve deficit.  Skin: No rash noted. No erythema.  Psychiatric: Affect normal.   LABORATORY PANEL:  Female CBC Recent Labs  Lab 03/27/17 1743  WBC 9.1  HGB 13.9  HCT 42.2  PLT 394   ------------------------------------------------------------------------------------------------------------------ Chemistries  Recent Labs  Lab 03/28/17 0505  NA 140  K 3.4*  CL 102  CO2 30  GLUCOSE 120*  BUN 12  CREATININE 0.69  CALCIUM 8.5*  MG 1.8   RADIOLOGY:  Dg Chest 2 View  Result Date: 03/27/2017 CLINICAL DATA:  Chest pain lasting 15-20 minutes today.  Dyspnea. EXAM: CHEST  2 VIEW COMPARISON:  08/18/2016 and 03/21/2011 CXR FINDINGS: Heart is top-normal in size. The thoracic aorta is tortuous in appearance and uncoiled. No pneumonic consolidations. Mild interstitial prominence is noted of the lungs that may reflect chronic bronchitic change or possibly mild interstitial edema. Minimal atelectasis is seen at the left lung base. No significant pleural fluid collections or pneumothorax. Degenerative change along the dorsal spine with mild kyphosis along the lower thoracic spine, similar in appearance dating back to 2010. IMPRESSION: Mild diffuse interstitial prominence more likely reflecting mild interstitial edema and/or bronchitic change. Electronically Signed   By: Tollie Ethavid  Kwon M.D.   On: 03/27/2017 18:50   ASSESSMENT AND PLAN:   1.  Hypertensive emergency.   Continue Lasix, Coreg, losartan,  IV hydralazine as needed.  2.  New onset acute diastolic CHF, LV EF: 50% -   55% Continue Lasix IV  every 12 hours, fluid restriction.  Follow-up echocardiogram.  3.  Chest pain, likely related to elevated hypertension and new onset CHF.  Troponin level is negative so far and there are no acute changes on the EKG. Continue aspirin and Coreg, lipid panel. Stress test today.  4.  UTI, will finish nitrofurantoin course.  Hypokalemia.  Give potassium supplement.  Morbid obesity.  Discussed with Dr. Kirke Corin. All the records are reviewed and case discussed with Care Management/Social Worker. Management plans discussed with the patient, family and they are in agreement.  CODE STATUS: Full Code  TOTAL TIME TAKING CARE OF THIS PATIENT: 33 minutes.   More than 50% of the time was spent in counseling/coordination of care: YES  POSSIBLE D/C IN 2 DAYS, DEPENDING ON CLINICAL CONDITION.   Shaune Pollack M.D on 03/28/2017 at 3:00 PM  Between 7am to 6pm - Pager - [phone]  After 6pm go to [email or web address] - Therapist, nutritional Hospitalists

## 2017-03-28 NOTE — Consult Note (Signed)
Provided patient with "Living Better with Heart Failure" packet. Briefly reviewed definition of heart failure and signs and symptoms of an exacerbation. Reviewed importance of and reason behind checking weight daily in the AM, after using the bathroom, but before getting dressed. Discussed when to call the Dr= weight gain of >2lb overnight of 5lb in a week,  Discussed yellow zone= call MD: weight gain of >2lb overnight of 5lb in a week, increased swelling, increased SOB when lying down, chest discomfort, dizziness, increased fatigue Red Zone= call 911: struggle to breath, fainting or near fainting, significant chest pain Reviewed low sodium diet <2g/day-provided handout of recommended and not recommended foods  Fluid restriction <2L/day Reviewed how to read nutrition label Reviewed medication changes:carvedilol, irbasartan, furosemide Explained briefly why pt is on the medications (either make you feel better, live longer or keep you out of the hospital) and discussed monitoring and side effects Discussed tobacco cessation: pt does not smoke Discussed exercise: pt is able to exercise but does not. Spoke with patient about importance of exercise, starting slow and working up to 30 min of moderate exercise (ie walking) 5 times a week  Patient seems a little over whelmed. Did not seem very engaged at first, but asked questions at end. Stressed importance of blood pressure control, medication compliance, life style modifications  Freeland Pracht D Wade Asebedo, Pharm.D, BCPS Clinical Pharmacist

## 2017-03-28 NOTE — Consult Note (Signed)
Cardiology Consultation:   Patient ID: Mindy Myers; [id number]; 1960/04/09   Admit date: 03/27/2017 Date of Consult: 03/28/2017  Primary Care Provider: Patient, No Pcp Per Primary Cardiologist: new to [facility] - consult by Arida   Patient Profile:   Mindy Myers is a 57 y.o. female with a hx of poorly controlled HTN off medications, obesity who is being seen today for the evaluation of chest pain at the request of Dr. Imogene Burn, MD.  History of Present Illness:   [name] was evaluated in 2013 for atypical chest pain, ruled out and underwent nuclear stress testing that was without evidence of ischemia. She reports being diagnosed with HTN years ago and has reportedly been managed with 3 antihypertensives. She has been out of these medications for several months 2/2 finances. She recently presented to a new PCP on 1/4 and was noted to have BP of 210/124 as well as a UTI. She was started on metoprolol 50 mg bid and Macrobid. While at the grocery store she became flushed with substernal chest pain that lasted ~ 10 minutes before self resolving. She felt like she was in a "warm part of the grocery store." She contacted a friend who advised her to come to the ED.   Upon the patient's arrival to [facility] they were found to have BP 202/86-->178/81, HR 72 bpm, temp 97.5, oxygen saturation 100% on room air, weight reported at 270 pounds in the ED with admission weight of  244 pounds. EKG showed NSR, LVH with early repol and nonspecific lateral st/t changes as detailed below, CXR showed mild interstitial edema. Labs showed troponin negative x 2, BNP 83, cbc unremarkable, BUN/SCR 14/0.69-->12/0.69, K+ 4.0-->3.4, Na 140 x 2, glucose 109-->120. She has been started on Coreg 6.25 mg bid along with IV Lasix 40 mg and nitro paste has been applied. No documented UOP for the admission. Currently, does not feel back to baseline, though improved. Echo is pending.   Past Medical History:  Diagnosis Date  .  Hypertension     Past Surgical History:  Procedure Laterality Date  . HAND SURGERY Left      Home Meds: Prior to Admission medications   Medication Sig Start Date End Date Taking? Authorizing Provider  metoprolol tartrate (LOPRESSOR) 50 MG tablet Take 50 mg by mouth 2 (two) times daily.   Yes [provider]  nitrofurantoin, macrocrystal-monohydrate, (MACROBID) 100 MG capsule Take 100 mg by mouth 2 (two) times daily.   Yes [provider]  metoprolol tartrate (LOPRESSOR) 25 MG tablet Take 1 tablet (25 mg total) by mouth 2 (two) times daily. Patient not taking: Reported on 03/27/2017 08/18/16 08/18/17  Rockne Menghini, MD  naproxen (NAPROSYN) 500 MG tablet Take 1 tablet (500 mg total) by mouth 2 (two) times daily with a meal. Patient not taking: Reported on 03/27/2017 11/01/15   Kem Boroughs B, FNP  predniSONE (DELTASONE) 10 MG tablet Take 1 tablet (10 mg total) by mouth daily. Day 1-2: Take 50 mg  ( 5 pills) Day 3-4 : Take 40 mg (4pills) Day 5-6: Take 30 mg (3 pills) Day 7-8:  Take 20 mg (2 pills) Day 9:  Take 10mg  (1 pill) Patient not taking: Reported on 03/27/2017 08/18/16   Willy Eddy, MD    Inpatient Medications: Scheduled Meds: . aspirin EC  81 mg Oral Daily  . carvedilol  12.5 mg Oral BID WC  . enoxaparin (LOVENOX) injection  40 mg Subcutaneous Q24H  . furosemide  40  mg Intravenous Q12H  . irbesartan  150 mg Oral Daily  . nitrofurantoin (macrocrystal-monohydrate)  100 mg Oral BID  . potassium chloride  40 mEq Oral Once  . sodium chloride flush  3 mL Intravenous Q12H   Continuous Infusions: . sodium chloride     PRN Meds: sodium chloride, acetaminophen, hydrALAZINE, ondansetron (ZOFRAN) IV, sodium chloride flush  Allergies:  No Known Allergies  Social History:   Social History   Socioeconomic History  . Marital status: Single    Spouse name: Not on file  . Number of children: Not on file  . Years of education: Not on file  . Highest  education level: Not on file  Social Needs  . Financial resource strain: Not on file  . Food insecurity - worry: Not on file  . Food insecurity - inability: Not on file  . Transportation needs - medical: Not on file  . Transportation needs - non-medical: Not on file  Occupational History  . Not on file  Tobacco Use  . Smoking status: Never Smoker  . Smokeless tobacco: Never Used  Substance and Sexual Activity  . Alcohol use: No  . Drug use: No  . Sexual activity: Not on file  Other Topics Concern  . Not on file  Social History Narrative  . Not on file     Family History: Family History  Problem Relation Age of Onset  . Hypertension Mother     ROS:  Review of Systems  Constitutional: Positive for malaise/fatigue. Negative for chills, diaphoresis, fever and weight loss.  HENT: Negative for congestion.   Eyes: Negative for discharge and redness.  Respiratory: Negative for cough, hemoptysis, sputum production, shortness of breath and wheezing.   Cardiovascular: Positive for chest pain. Negative for palpitations, orthopnea, claudication, leg swelling and PND.  Gastrointestinal: Negative for abdominal pain, blood in stool, heartburn, melena, nausea and vomiting.  Genitourinary: Negative for hematuria.  Musculoskeletal: Negative for falls and myalgias.  Skin: Negative for rash.  Neurological: Negative for dizziness, tingling, tremors, sensory change, speech change, focal weakness, loss of consciousness and weakness.  Endo/Heme/Allergies: Does not bruise/bleed easily.  Psychiatric/Behavioral: Negative for substance abuse. The patient is not nervous/anxious.   All other systems reviewed and are negative.     Physical Exam/Data:   Vitals:   03/27/17 2200 03/27/17 2243 03/28/17 0423 03/28/17 0822  BP: (!) 196/99 (!) 175/86 (!) 150/72 (!) 178/81  Pulse:  68 69 70  Resp:  19 19 18   Temp:  98.2 F (36.8 C) 98.6 F (37 C) 98 F (36.7 C)  TempSrc:  Oral  Oral  SpO2:  96% 96%  97%  Weight:  244 lb 11.2 oz (111 kg)    Height:  5\' 9"  (1.753 m)      Intake/Output Summary (Last 24 hours) at 03/28/2017 1048 Last data filed at 03/28/2017 1011 Gross per 24 hour  Intake 0 ml  Output -  Net 0 ml   Filed Weights   03/27/17 1738 03/27/17 2243  Weight: 270 lb (122.5 kg) 244 lb 11.2 oz (111 kg)   Body mass index is 36.14 kg/m.   Physical Exam: General: Well developed, well nourished, in no acute distress. Head: Normocephalic, atraumatic, sclera non-icteric, no xanthomas, nares without discharge.  Neck: Negative for carotid bruits. JVD not elevated. Lungs: Clear bilaterally to auscultation without wheezes, rales, or rhonchi. Breathing is unlabored. Heart: RRR with S1 S2. No murmurs, rubs, or gallops appreciated. Abdomen: Obese, soft, non-tender, non-distended with normoactive bowel sounds.  No hepatomegaly. No rebound/guarding. No obvious abdominal masses. Msk:  Strength and tone appear normal for age. Extremities: No clubbing or cyanosis. No edema. Distal pedal pulses are 2+ and equal bilaterally. Neuro: Alert and oriented X 3. No facial asymmetry. No focal deficit. Moves all extremities spontaneously. Psych:  Responds to questions appropriately with a normal affect.   EKG:  The EKG was personally reviewed and demonstrates: NSR, 63 bpm, LVH with early repolarization, nonspecific lateral st/t changes Telemetry:  Telemetry was personally reviewed and demonstrates: NSR  Weights: Filed Weights   03/27/17 1738 03/27/17 2243  Weight: 270 lb (122.5 kg) 244 lb 11.2 oz (111 kg)    Relevant CV Studies: TTE pending  Laboratory Data:  Chemistry Recent Labs  Lab 03/27/17 1743 03/28/17 0505  NA 140 140  K 4.0 3.4*  CL 104 102  CO2 25 30  GLUCOSE 109* 120*  BUN 14 12  CREATININE 0.69 0.69  CALCIUM 8.8* 8.5*  GFRNONAA >60 >60  GFRAA >60 >60  ANIONGAP 11 8    No results for input(s): PROT, ALBUMIN, AST, ALT, ALKPHOS, BILITOT in the last 168  hours. Hematology Recent Labs  Lab 03/27/17 1743  WBC 9.1  RBC 4.86  HGB 13.9  HCT 42.2  MCV 86.9  MCH 28.6  MCHC 32.9  RDW 13.8  PLT 394   Cardiac Enzymes Recent Labs  Lab 03/27/17 1743 03/27/17 2026  TROPONINI <0.03 <0.03   No results for input(s): TROPIPOC in the last 168 hours.  BNP Recent Labs  Lab 03/27/17 1743  BNP 83.0    DDimer No results for input(s): DDIMER in the last 168 hours.  Radiology/Studies:  Dg Chest 2 View  Result Date: 03/27/2017 IMPRESSION: Mild diffuse interstitial prominence more likely reflecting mild interstitial edema and/or bronchitic change. Electronically Signed   By: Tollie Eth M.D.   On: 03/27/2017 18:50    Assessment and Plan:   1. Chest pain with moderate risk for cardiac etiology: -Overall, atypical in presentation likely in the setting of her poorly controlled HTN -Troponin negative x 2, cycle third set to rule out -Echo pending -Lexiscan Myoview this morning to evaluate for high-risk ischemia -ASA -Coreg -Lipids pending -Check A1c for further risk stratification  2. Accelerated HTN: -Improving -Agree with Coreg 12.5 mg bid, titrate as needed -Irbesartan  -Would benefit from outpatient sleep study -May need secondary HTN workup as outpatient -Check TSH  3. Acute, likely diastolic CHF: -IV Lasix for now, can likely transition to PO this afternoon -Echo pending -Likely in the setting of poorly controlled HTN  4. Hypokalemia: -Replete to goal > 4.0  5. Morbid obesity: -Weight loss advised   For questions or updates, please contact [facility] Please consult [email or web address] for contact info under Cardiology/STEMI.   Signed, Eula Listen, PA-C [facility] Pager: [phone] 03/28/2017, 10:48 AM

## 2017-03-28 NOTE — Progress Notes (Signed)
*  PRELIMINARY RESULTS* Echocardiogram 2D Echocardiogram has been performed.  Cristela BlueHege, Kerianne Gurr 03/28/2017, 9:29 AM

## 2017-03-28 NOTE — Care Management Note (Signed)
Case Management Note  Patient Details  Name: Mindy Myers MRN: [id number] Date of Birth: [birth year]  Subjective/Objective:                 Patient presented with chest pain.  found to have systolic blood pressure of greater than 200. Found to have new onset heart failure most likely due to untreated hypertension.  She was started on metoprolol.  She has no insurance but just started working at [employer] so will have insurance.  She [facility] and is calling him to get re established.  She declines offer to have an appointment set up with one of the local sliding scale clinics.  She has access to scales. Discussed [facility] generic list and at present it appears current meds are on the list   Action/Plan:  Monitor for the possibility of medications assistance.  Expected Discharge Date:                  Expected Discharge Plan:     In-House Referral:     Discharge planning Services     Post Acute Care Choice:    Choice offered to:     DME Arranged:    DME Agency:     HH Arranged:    HH Agency:     Status of Service:     If discussed at [employer] Meetings, dates discussed:    Additional Comments:  Eber HongGreene, Anastaisa Wooding R, RN 03/28/2017, 9:43 AM

## 2017-03-28 NOTE — Progress Notes (Signed)
Patient is emotional this afternoon.  She seems to be overwhelmed by the testing and information.  BP remains elevated at 180.

## 2017-03-28 NOTE — H&P (Signed)
[facility] [place] [facility]   PATIENT NAME: Mindy HoltsKaren Myers    MR#:  [id number]  DATE OF BIRTH:  June 16, 1960  DATE OF ADMISSION:  03/27/2017  PRIMARY CARE PHYSICIAN: Patient, No Pcp Per   REQUESTING/REFERRING PHYSICIAN:   CHIEF COMPLAINT:   Chief Complaint  Patient presents with  . Chest Pain    HISTORY OF PRESENT ILLNESS: Mindy Myers  is a 57 y.o. female with a known history of hypertension newly diagnosed. Patient was seen 3 days ago by a primary care physician for a UTI.  During  that visit, patient was told that her blood pressure is very high.  She was started on metoprolol.  Patient comes to emergency room this time for acute onset of retrosternal chest pain described as 8 out of 10 pressure associated with lightheadedness.  Denies any radiation of her chest pain.  Patient denies having similar episodes in the past. Upon evaluation in emergency room, troponin level was negative and EKG shows LVH.  Pressure is elevated at 210/124.  Chest x-ray shows mild interstitial edema. Patient is admitted for further evaluation and management.  PAST MEDICAL HISTORY:   Past Medical History:  Diagnosis Date  . Hypertension     PAST SURGICAL HISTORY:  Past Surgical History:  Procedure Laterality Date  . HAND SURGERY Left     SOCIAL HISTORY:  Social History   Tobacco Use  . Smoking status: Never Smoker  . Smokeless tobacco: Never Used  Substance Use Topics  . Alcohol use: No    FAMILY HISTORY: History reviewed. No pertinent family history.  DRUG ALLERGIES: No Known Allergies  REVIEW OF SYSTEMS:   CONSTITUTIONAL: No fever, fatigue or weakness.  EYES: No blurred or double vision.  EARS, NOSE, AND THROAT: No tinnitus or ear pain.  RESPIRATORY: No cough, shortness of breath, wheezing or hemoptysis.  CARDIOVASCULAR: Pt c/o chest pain and lightheadedness; no orthopnea, edema.  GASTROINTESTINAL: No nausea, vomiting, diarrhea or abdominal pain.   GENITOURINARY: No dysuria, hematuria.  ENDOCRINE: No polyuria, nocturia,  HEMATOLOGY: No anemia, easy bruising or bleeding SKIN: No rash or lesion. MUSCULOSKELETAL: No joint pain or arthritis.   NEUROLOGIC: No focal weakness.  PSYCHIATRY: No anxiety or depression.   MEDICATIONS AT HOME:  Prior to Admission medications   Medication Sig Start Date End Date Taking? Authorizing Provider  metoprolol tartrate (LOPRESSOR) 50 MG tablet Take 50 mg by mouth 2 (two) times daily.   Yes [provider]  nitrofurantoin, macrocrystal-monohydrate, (MACROBID) 100 MG capsule Take 100 mg by mouth 2 (two) times daily.   Yes [provider]  metoprolol tartrate (LOPRESSOR) 25 MG tablet Take 1 tablet (25 mg total) by mouth 2 (two) times daily. Patient not taking: Reported on 03/27/2017 08/18/16 08/18/17  Rockne MenghiniNorman, Anne-Caroline, MD  naproxen (NAPROSYN) 500 MG tablet Take 1 tablet (500 mg total) by mouth 2 (two) times daily with a meal. Patient not taking: Reported on 03/27/2017 11/01/15   Kem Boroughsriplett, Cari B, FNP  predniSONE (DELTASONE) 10 MG tablet Take 1 tablet (10 mg total) by mouth daily. Day 1-2: Take 50 mg  ( 5 pills) Day 3-4 : Take 40 mg (4pills) Day 5-6: Take 30 mg (3 pills) Day 7-8:  Take 20 mg (2 pills) Day 9:  Take 10mg  (1 pill) Patient not taking: Reported on 03/27/2017 08/18/16   Willy Eddyobinson, Patrick, MD      PHYSICAL EXAMINATION:   VITAL SIGNS: Blood pressure (!) 150/72, pulse 69, temperature 98.6 F (37 C), resp.  rate 19, height 5\' 9"  (1.753 m), weight 111 kg (244 lb 11.2 oz), SpO2 96 %.  GENERAL:  57 y.o.-year-old patient lying in the bed with no acute distress.  EYES: Pupils equal, round, reactive to light and accommodation. No scleral icterus. Extraocular muscles intact.  HEENT: Head atraumatic, normocephalic. Oropharynx and nasopharynx clear.  NECK:  Supple, no jugular venous distention. No thyroid enlargement, no tenderness.  LUNGS: Reduced breath sounds bilaterally; no wheezing,  rales,rhonchi or crepitation. No use of accessory muscles of respiration.  CARDIOVASCULAR: S1, S2 normal. No S3/S4.  ABDOMEN: Soft, nontender, nondistended. Bowel sounds present. No organomegaly or mass.  EXTREMITIES: No pedal edema, cyanosis, or clubbing.  NEUROLOGIC: No focal weakness. Gait not checked.  PSYCHIATRIC: The patient is alert and oriented x 3.  SKIN: No obvious rash, lesion, or ulcer.   LABORATORY PANEL:   CBC Recent Labs  Lab 03/27/17 1743  WBC 9.1  HGB 13.9  HCT 42.2  PLT 394  MCV 86.9  MCH 28.6  MCHC 32.9  RDW 13.8   ------------------------------------------------------------------------------------------------------------------  Chemistries  Recent Labs  Lab 03/27/17 1743  NA 140  K 4.0  CL 104  CO2 25  GLUCOSE 109*  BUN 14  CREATININE 0.69  CALCIUM 8.8*   ------------------------------------------------------------------------------------------------------------------ estimated creatinine clearance is 101.8 mL/min (by C-G formula based on SCr of 0.69 mg/dL). ------------------------------------------------------------------------------------------------------------------ No results for input(s): TSH, T4TOTAL, T3FREE, THYROIDAB in the last 72 hours.  Invalid input(s): FREET3   Coagulation profile No results for input(s): INR, PROTIME in the last 168 hours. ------------------------------------------------------------------------------------------------------------------- No results for input(s): DDIMER in the last 72 hours. -------------------------------------------------------------------------------------------------------------------  Cardiac Enzymes Recent Labs  Lab 03/27/17 1743 03/27/17 2026  TROPONINI <0.03 <0.03   ------------------------------------------------------------------------------------------------------------------ Invalid input(s):  POCBNP  ---------------------------------------------------------------------------------------------------------------  Urinalysis No results found for: COLORURINE, APPEARANCEUR, LABSPEC, PHURINE, GLUCOSEU, HGBUR, BILIRUBINUR, KETONESUR, PROTEINUR, UROBILINOGEN, NITRITE, LEUKOCYTESUR   RADIOLOGY: Dg Chest 2 View  Result Date: 03/27/2017 CLINICAL DATA:  Chest pain lasting 15-20 minutes today.  Dyspnea. EXAM: CHEST  2 VIEW COMPARISON:  08/18/2016 and 03/21/2011 CXR FINDINGS: Heart is top-normal in size. The thoracic aorta is tortuous in appearance and uncoiled. No pneumonic consolidations. Mild interstitial prominence is noted of the lungs that may reflect chronic bronchitic change or possibly mild interstitial edema. Minimal atelectasis is seen at the left lung base. No significant pleural fluid collections or pneumothorax. Degenerative change along the dorsal spine with mild kyphosis along the lower thoracic spine, similar in appearance dating back to 2010. IMPRESSION: Mild diffuse interstitial prominence more likely reflecting mild interstitial edema and/or bronchitic change. Electronically Signed   By: Tollie Eth M.D.   On: 03/27/2017 18:50    EKG: Orders placed or performed during the hospital encounter of 03/27/17  . ED EKG within 10 minutes  . ED EKG within 10 minutes    IMPRESSION AND PLAN: 1.  Hypertensive emergency.  Will start blood pressure medications with goal of lowering the blood pressure gradually.  Monitor clinically closely. 2.  New onset CHF, likely related to long-term uncontrolled hypertension.  We will check 2D echo.  Will start Lasix. 3.  Chest pain, likely related to elevated hypertension and new onset CHF.  Troponin level is negative so far and there are no acute changes on the EKG. 4.  UTI, will finish nitrofurantoin course.  All the records are reviewed and case discussed with ED provider. Management plans discussed with the patient, family and they are in  agreement.  CODE STATUS:    Code Status  Orders  (From admission, onward)        Start     Ordered   03/27/17 2233  Full code  Continuous     03/27/17 2232    Code Status History    Date Active Date Inactive Code Status Order ID Comments User Context   This patient has a current code status but no historical code status.       TOTAL TIME TAKING CARE OF THIS PATIENT: 35 minutes.    Cammy Copa M.D on 03/28/2017 at 5:50 AM  Between 7am to 6pm - Pager - [phone]  After 6pm go to [email or web address] - password [email or web address]  [facility]  Office  [phone]  CC: Primary care physician; Patient, No Pcp Per

## 2017-03-29 ENCOUNTER — Telehealth: Payer: Self-pay | Admitting: *Deleted

## 2017-03-29 LAB — LIPID PANEL
CHOLESTEROL: 186 mg/dL (ref 0–200)
HDL: 37 mg/dL — ABNORMAL LOW (ref >40)
LDL Cholesterol: 123 mg/dL — ABNORMAL HIGH (ref 0–99)
TRIGLYCERIDES: 129 mg/dL (ref <150)
Total CHOL/HDL Ratio: 5 RATIO
VLDL: 26 mg/dL (ref 0–40)

## 2017-03-29 LAB — BASIC METABOLIC PANEL
Anion gap: 9 (ref 5–15)
BUN: 20 mg/dL (ref 6–20)
CALCIUM: 8.6 mg/dL — AB (ref 8.9–10.3)
CO2: 28 mmol/L (ref 22–32)
Chloride: 102 mmol/L (ref 101–111)
Creatinine, Ser: 0.96 mg/dL (ref 0.44–1.00)
GFR calc Af Amer: >60 mL/min (ref >60)
GFR calc non Af Amer: >60 mL/min (ref >60)
GLUCOSE: 100 mg/dL — AB (ref 65–99)
Potassium: 3.7 mmol/L (ref 3.5–5.1)
Sodium: 139 mmol/L (ref 135–145)

## 2017-03-29 LAB — HIV ANTIBODY (ROUTINE TESTING W REFLEX): HIV SCREEN 4TH GENERATION: NONREACTIVE

## 2017-03-29 MED ORDER — FUROSEMIDE 40 MG PO TABS: 40.0000 mg | ORAL_TABLET | Freq: Every day | ORAL | 1 refills | Status: DC

## 2017-03-29 MED ORDER — IRBESARTAN 300 MG PO TABS: 300.0000 mg | ORAL_TABLET | Freq: Every day | ORAL | 1 refills | Status: DC

## 2017-03-29 MED ORDER — ATORVASTATIN CALCIUM 20 MG PO TABS: 40.0000 mg | ORAL_TABLET | Freq: Every day | ORAL | Status: DC

## 2017-03-29 MED ORDER — FUROSEMIDE 40 MG PO TABS
40.0000 mg | ORAL_TABLET | Freq: Every day | ORAL | Status: DC
  Administered 2017-03-29: 40 mg via ORAL
  Filled 2017-03-29: qty 1

## 2017-03-29 MED ORDER — ATORVASTATIN CALCIUM 40 MG PO TABS: 40.0000 mg | ORAL_TABLET | Freq: Every day | ORAL | 1 refills | Status: DC

## 2017-03-29 MED ORDER — ASPIRIN 81 MG PO TBEC: 81.0000 mg | DELAYED_RELEASE_TABLET | Freq: Every day | ORAL | 2 refills | Status: DC

## 2017-03-29 MED ORDER — CARVEDILOL 12.5 MG PO TABS
12.5000 mg | ORAL_TABLET | Freq: Two times a day (BID) | ORAL | 1 refills | Status: DC
Start: 2017-03-29 — End: 2017-04-19

## 2017-03-29 MED ORDER — IRBESARTAN 150 MG PO TABS
300.0000 mg | ORAL_TABLET | Freq: Every day | ORAL | Status: DC
  Administered 2017-03-29: 300 mg via ORAL
  Filled 2017-03-29: qty 2

## 2017-03-29 NOTE — Progress Notes (Signed)
[facility] [place] at [facility]        Mindy HoltsKaren Myers was admitted to the Hospital on 03/27/2017 and Discharged  03/29/2017 and should be excused from work/school   for 8  days starting 03/27/2017 , may return to work/school without any restrictions.  Mindy PollackQing Joanne Myers M.D on 03/29/2017,at 8:49 AM  [facility] [place] at [facility]    Office  [phone]

## 2017-03-29 NOTE — Discharge Instructions (Signed)
Heart Failure Clinic appointment on April 05 2017 at 12:20pm with Clarisa Kindredina Hackney, FNP. Please call [phone] to reschedule.  Heart healthy diet.

## 2017-03-29 NOTE — Plan of Care (Signed)
  Progressing Education: Knowledge of General Education information will improve 03/29/2017 1107 - Progressing by Myles GipKimrey, Melo Stauber M, RN Clinical Measurements: Respiratory complications will improve 03/29/2017 1107 - Progressing by Myles GipKimrey, Nikola Marone M, RN Pain Managment: General experience of comfort will improve 03/29/2017 1107 - Progressing by Myles GipKimrey, Ishana Blades M, RN Safety: Ability to remain free from injury will improve 03/29/2017 1107 - Progressing by Myles GipKimrey, Maximino Cozzolino M, RN

## 2017-03-29 NOTE — Progress Notes (Signed)
Pt d/c to home today.  IV removed intact.  D/c paperwork printed and reviewed w/pt.  All medication questions and concerns reviewed and pt states understanding.  All Rx's given to patient. Pt requested to wheelchaired out for d/c.    

## 2017-03-29 NOTE — Telephone Encounter (Signed)
Patient currently admitted at this time. 

## 2017-03-29 NOTE — Care Management (Signed)
Patient verbalizes no concerns regarding obtaining her medications.  Provided her with the [facility] generic list and all of her meds are included but she says she will go [facility] who also helps her with medication costs.

## 2017-03-29 NOTE — Discharge Summary (Signed)
[facility] - [place] at [facility]   PATIENT NAME: Laurynn Mccorvey    MR#:  [id number]  DATE OF BIRTH:  1959/07/28  DATE OF ADMISSION:  03/27/2017   ADMITTING PHYSICIAN: Cammy Copa, MD  DATE OF DISCHARGE: 03/29/2017  PRIMARY CARE PHYSICIAN: Patient, No Pcp Per   ADMISSION DIAGNOSIS:  Acute pulmonary edema (HCC) [J81.0] Uncontrolled hypertension [I10] Chest pain, unspecified type [R07.9] DISCHARGE DIAGNOSIS:  Active Problems:   CHF (congestive heart failure) (HCC)  SECONDARY DIAGNOSIS:   Past Medical History:  Diagnosis Date  . Hypertension    HOSPITAL COURSE:   1.Hypertensive emergency. Continue Lasix, Coreg, irbesartan, IV hydralazine as needed.  2.New onset acute diastolic CHF,LV EF: 50% - 55% She has been treated with Lasix IV every 12 hours, fluid restriction.   Change to po 40 mg daily.  3.Chest pain,likely related to elevated hypertension and new onset CHF.Troponin level is negative so far and there areno acute changes on the EKG. Continue aspirin and Coreg, lipid panel: LDL 123. Add Lipitor. Stress test : normal.  4.UTI,continue home nitrofurantoin course.  Hypokalemia.    Improved with potassium supplement.  Morbid obesity.  Discussed with cardiology PA Mr. Shea Evans. DISCHARGE CONDITIONS:  Stable, discharge to home today. CONSULTS OBTAINED:  Treatment Team:  Iran Ouch, MD DRUG ALLERGIES:  No Known Allergies DISCHARGE MEDICATIONS:   Allergies as of 03/29/2017   No Known Allergies     Medication List    STOP taking these medications   metoprolol tartrate 25 MG tablet Commonly known as:  LOPRESSOR   metoprolol tartrate 50 MG tablet Commonly known as:  LOPRESSOR   naproxen 500 MG tablet Commonly known as:  NAPROSYN   predniSONE 10 MG tablet Commonly known as:  DELTASONE     TAKE these medications   aspirin 81 MG EC tablet Take 1 tablet (81 mg total) by mouth daily.   atorvastatin 40 MG  tablet Commonly known as:  LIPITOR Take 1 tablet (40 mg total) by mouth daily at 6 PM.   carvedilol 12.5 MG tablet Commonly known as:  COREG Take 1 tablet (12.5 mg total) by mouth 2 (two) times daily with a meal.   furosemide 40 MG tablet Commonly known as:  LASIX Take 1 tablet (40 mg total) by mouth daily.   irbesartan 300 MG tablet Commonly known as:  AVAPRO Take 1 tablet (300 mg total) by mouth daily.   nitrofurantoin (macrocrystal-monohydrate) 100 MG capsule Commonly known as:  MACROBID Take 100 mg by mouth 2 (two) times daily.        DISCHARGE INSTRUCTIONS:  See AVS.  If you experience worsening of your admission symptoms, develop shortness of breath, life threatening emergency, suicidal or homicidal thoughts you must seek medical attention immediately by calling 911 or calling your MD immediately  if symptoms less severe.  You Must read complete instructions/literature along with all the possible adverse reactions/side effects for all the Medicines you take and that have been prescribed to you. Take any new Medicines after you have completely understood and accpet all the possible adverse reactions/side effects.   Please note  You were cared for by a hospitalist during your hospital stay. If you have any questions about your discharge medications or the care you received while you were in the hospital after you are discharged, you can call the unit and asked to speak with the hospitalist on call if the hospitalist that took care of you is not available. Once you are discharged,  your primary care physician will handle any further medical issues. Please note that NO REFILLS for any discharge medications will be authorized once you are discharged, as it is imperative that you return to your primary care physician (or establish a relationship with a primary care physician if you do not have one) for your aftercare needs so that they can reassess your need for medications and  monitor your lab values.    On the day of Discharge:  VITAL SIGNS:  Blood pressure (!) 141/48, pulse 71, temperature 98.3 F (36.8 C), resp. rate 16, height 5\' 9"  (1.753 m), weight 263 lb 3.2 oz (119.4 kg), SpO2 96 %. PHYSICAL EXAMINATION:  GENERAL:  57 y.o.-year-old patient lying in the bed with no acute distress.  EYES: Pupils equal, round, reactive to light and accommodation. No scleral icterus. Extraocular muscles intact.  HEENT: Head atraumatic, normocephalic. Oropharynx and nasopharynx clear.  NECK:  Supple, no jugular venous distention. No thyroid enlargement, no tenderness.  LUNGS: Normal breath sounds bilaterally, no wheezing, rales,rhonchi or crepitation. No use of accessory muscles of respiration.  CARDIOVASCULAR: S1, S2 normal. No murmurs, rubs, or gallops.  ABDOMEN: Soft, non-tender, non-distended. Bowel sounds present. No organomegaly or mass.  EXTREMITIES: No pedal edema, cyanosis, or clubbing.  NEUROLOGIC: Cranial nerves II through XII are intact. Muscle strength 5/5 in all extremities. Sensation intact. Gait not checked.  PSYCHIATRIC: The patient is alert and oriented x 3.  SKIN: No obvious rash, lesion, or ulcer.  DATA REVIEW:   CBC Recent Labs  Lab 03/27/17 1743  WBC 9.1  HGB 13.9  HCT 42.2  PLT 394    Chemistries  Recent Labs  Lab 03/28/17 0505 03/29/17 0421  NA 140 139  K 3.4* 3.7  CL 102 102  CO2 30 28  GLUCOSE 120* 100*  BUN 12 20  CREATININE 0.69 0.96  CALCIUM 8.5* 8.6*  MG 1.8  --      Microbiology Results  No results found for this or any previous visit.  RADIOLOGY:  Nm Myocar Multi W/spect W/wall Motion / Ef  Result Date: 03/28/2017  There was no ST segment deviation noted during stress.  No T wave inversion was noted during stress.  Defect 1: There is a small defect of mild severity present in the apex location. This is likely due to breast attenuation  The study is normal.  This is a low risk study.  The left ventricular  ejection fraction is mildly decreased (45-54%). Mildly reduced EF likely due to hypertensive heart disease      Management plans discussed with the patient, family and they are in agreement.  CODE STATUS: Full Code   TOTAL TIME TAKING CARE OF THIS PATIENT: 33 minutes.    Shaune PollackQing Jordynne Mccown M.D on 03/29/2017 at 12:08 PM  Between 7am to 6pm - Pager - [phone]  After 6pm go to [email or web address] - Social research officer, [email or web address] [email or web address]  [facility] [place] [facility]  Office  [phone]  CC: Primary care physician; Patient, No Pcp Per   Note: This dictation was prepared with Dragon dictation along with smaller phrase technology. Any transcriptional errors that result from this process are unintentional.

## 2017-03-29 NOTE — Telephone Encounter (Signed)
-----   Message from Coralee RudSabrina F Gilley sent at 03/29/2017  9:28 AM EST ----- Regarding: tcm/ph 1/15 2pm Eula Listenyan Dunn, PA

## 2017-03-30 NOTE — Telephone Encounter (Signed)
[facility]/17.   Patient understands to follow up with provider ? [facility]  Patient understands discharge instructions? Yes  Patient understands medications and regiment? Yes  Patient understands to bring all medications to this visit? Yes

## 2017-04-01 ENCOUNTER — Encounter: Payer: Self-pay | Admitting: Physician Assistant

## 2017-04-01 NOTE — Progress Notes (Signed)
Cardiology Office Note Date:  04/05/2017  Patient ID:  Mindy Myers, DOB 01/13/1961, MRN [id number] PCP:  Patient, No Pcp Per  Cardiologist:  Dr. Kirke CorinArida, MD    Chief Complaint: Hospital follow up  History of Present Illness: Mindy Myers is a 57 y.o. female with history of hypertensive heart disease, HLD, and obesity who presents for hospital follow up after recent admission to [facility] from 1/7-1/8 for accelerated hypertension.   She was previously evaluated in 2013 for atypical chest pain, ruled out and underwent nuclear stress testing that was without evidence of ischemia. She reports being diagnosed with HTN years ago and has reportedly been managed with 3 antihypertensives. She had been out of these medications for several months 2/2 finances. She recently presented to a new PCP on 1/4 and was noted to have BP of 210/124 as well as a UTI. She was started on metoprolol 50 mg bid and Macrobid. While at the grocery store on 1/6, she became flushed with substernal chest pain that lasted ~ 10 minutes before self resolving. She presented to [facility] ED where she was noted to be hypertensive with a BP of 202/86. Troponin negative x 2, BNP 83, CXR with mild interstitial edema vs bronchitis, EKG with sinus rhythm with LVH and repolarization abnormalities. Echo showed EF of 50-55%, no RWMA, Gr1DD, mildly dilated left atrium, PASP could not be estimated. Lexiscan Myoview 03/28/2017 showed a small defect of mild severity present in the apex location felt to be 2/2 breast attenuation, EF 45-54% felt to be 2/2 hypertensive heart disease. This was a normal study. She was started on Lipitor, Coreg, irbesartan, and Lasix with a discharge BP of 141/48.   She comes in doing well today. Blood pressure log from home shows systolic readings ranging from 115-152 systolic/70s. She has been limiting Na to 1,200-2,000 mg daily. Weight at home has been stable, though did have a weight gain from 261-->264 pounds 1/12-->1/13,  after eating a bag of popcorn. Weight trended down to 257 pounds the following day. Limiting her fluids to 28-32 oz daily. Some positional dizziness. No further chest pain. Saw [facility] this morning. BP 151/80. No changes were made at that time.    Past Medical History:  Diagnosis Date  . CHF (congestive heart failure) (HCC)   . HLD (hyperlipidemia)   . Hypertension   . Hypertensive heart disease    a. TTE 1/19: EF 50-55%, no RWMA, Gr1DD, mildly dilated left atrium, PASP could not be estimated; b. MV 1/19: small defect of mild severity present in the apex location felt to be 2/2 breast attenuation, EF 45-54% felt to be 2/2 hypertensive heart disease. This was a normal study  . Obesity     Past Surgical History:  Procedure Laterality Date  . HAND SURGERY Left     Current Meds  Medication Sig  . aspirin EC 81 MG EC tablet Take 1 tablet (81 mg total) by mouth daily.  Marland Kitchen. atorvastatin (LIPITOR) 40 MG tablet Take 1 tablet (40 mg total) by mouth daily at 6 PM.  . carvedilol (COREG) 12.5 MG tablet Take 1 tablet (12.5 mg total) by mouth 2 (two) times daily with a meal.  . furosemide (LASIX) 40 MG tablet Take 1 tablet (40 mg total) by mouth daily.  . irbesartan (AVAPRO) 300 MG tablet Take 1 tablet (300 mg total) by mouth daily.    Allergies:   Patient has no known allergies.   Social History:  The patient  reports that  has never smoked. she has never used smokeless tobacco. She reports that she does not drink alcohol or use drugs.   Family History:  The patient's family history includes Cancer in her father; Hypertension in her father and mother.  ROS:   Review of Systems  Constitutional: Positive for malaise/fatigue. Negative for chills, diaphoresis, fever and weight loss.  HENT: Negative for congestion.   Eyes: Negative for discharge and redness.  Respiratory: Negative for cough, hemoptysis, sputum production, shortness of breath and wheezing.   Cardiovascular: Negative  for chest pain, palpitations, orthopnea, claudication, leg swelling and PND.  Gastrointestinal: Negative for abdominal pain, blood in stool, heartburn, melena, nausea and vomiting.  Genitourinary: Negative for hematuria.  Musculoskeletal: Negative for falls and myalgias.  Skin: Negative for rash.  Neurological: Negative for dizziness, tingling, tremors, sensory change, speech change, focal weakness, loss of consciousness and weakness.  Endo/Heme/Allergies: Does not bruise/bleed easily.  Psychiatric/Behavioral: Negative for substance abuse. The patient is not nervous/anxious.   All other systems reviewed and are negative.    PHYSICAL EXAM:  VS:  BP 132/68 (BP Location: Left Arm, Patient Position: Sitting, Cuff Size: Normal)   Pulse (!) 59   Ht 5\' 9"  (1.753 m)   Wt 262 lb (118.8 kg)   BMI 38.69 kg/m  BMI: Body mass index is 38.69 kg/m.  Physical Exam  Constitutional: She is oriented to person, place, and time. She appears well-developed and well-nourished.  HENT:  Head: Normocephalic and atraumatic.  Eyes: Right eye exhibits no discharge. Left eye exhibits no discharge.  Neck: Normal range of motion. No JVD present.  Cardiovascular: Normal rate, regular rhythm, S1 normal, S2 normal and normal heart sounds. Exam reveals no distant heart sounds, no friction rub, no midsystolic click and no opening snap.  No murmur heard. Pulses:      Posterior tibial pulses are 2+ on the right side, and 2+ on the left side.  Pulmonary/Chest: Effort normal and breath sounds normal. No respiratory distress. She has no decreased breath sounds. She has no wheezes. She has no rales. She exhibits no tenderness.  Abdominal: Soft. She exhibits no distension. There is no tenderness.  Musculoskeletal: She exhibits no edema.  Neurological: She is alert and oriented to person, place, and time.  Skin: Skin is warm and dry. No cyanosis. Nails show no clubbing.  Psychiatric: She has a normal mood and affect. Her  speech is normal and behavior is normal. Judgment and thought content normal.     EKG:  Was ordered and interpreted by me today. Shows sinus bradycardia, 59 bpm, LVH, TWI I, aVL, nonspecific st/t changes leads V4-V6  Recent Labs: 03/27/2017: B Natriuretic Peptide 83.0; Hemoglobin 13.9; Platelets 394 03/28/2017: Magnesium 1.8; TSH 2.451 03/29/2017: BUN 20; Creatinine, Ser 0.96; Potassium 3.7; Sodium 139  03/29/2017: Cholesterol 186; HDL 37; LDL Cholesterol 123; Total CHOL/HDL Ratio 5.0; Triglycerides 129; VLDL 26   Estimated Creatinine Clearance: 87.9 mL/min (by C-G formula based on SCr of 0.96 mg/dL).   Wt Readings from Last 3 Encounters:  04/05/17 262 lb (118.8 kg)  04/05/17 262 lb 4 oz (119 kg)  03/29/17 263 lb 3.2 oz (119.4 kg)     Other studies reviewed: Additional studies/records reviewed today include: summarized above  ASSESSMENT AND PLAN:  1. Hypertensive heart disease: Blood pressure is significantly improved compared to pre-admission readings. She is tolerating medications without issues, though does feel like she is continuing to get her feet back under her. BP readings range from 115-152  systolic/70s diastolic. She is doing a good job of limiting sodium as below. We will schedule her to see pulmonary medication for sleep study. We have decided to evaluate her for sleep apnea at this time prior to adding another antihypertensive medication. Ideally, I would like to see her systolic readings in the 110s mmHg, if tolerated. If we continue to see readings in the 130s mmHg systolic following sleep study and possible initiation of CPAP, consider renal artery ultrasound and laboratory review.    2. Diastolic dysfunction: She does not appear grossly volume overloaded at this time. She is limiting her PO fluid intake to 28-32 oz daily. I suspect this marked fluid restriction is playing some role in her positional dizziness. Given her preserved EF, she should be able to tolerate ~ 64 oz of water  daily. Continue to limit salt intake to < 2,000 mg daily. Optimal BP and HR control are advised. Call with weight gain > 3 pounds overnight or 5 pounds in 1 week. Daily weights.   3. Morbid obesity: Weight loss advised. She is doing a good job of keeping a food diary at this time, encouraged her to continue.   4. HLD: Lipitor 40 mg daily. LDL 123 on 03/29/2017. Will plan for fasting lipid panel and liver function in ~ 2-3 months.   Disposition: F/u with myself in 2 weeks  Current medicines are reviewed at length with the patient today.  The patient did not have any concerns regarding medicines.  Elinor Dodge PA-C 04/05/2017 2:06 PM     [facility] [address] [place], [place] [address] [phone]

## 2017-04-04 NOTE — Progress Notes (Signed)
Patient ID: Mindy Myers, female    DOB: 1960/06/25, 57 y.o.   MRN: [id number]  HPI  Ms Mindy Myers is a 57 y/o female with a history of hyperlipidemia, HTN and chronic heart failure.   Echo report from 03/28/17 showed an EF of 50-55%. Lexiscan myoview done 03/28/17 showed no significant perfusion to suggest ischemia or infarct.   Admitted 03/27/17 due to HTN and new onset HF. Cardiology consult was obtained. Initially needed IV hydralazine along with IV diuretics and then transitioned to oral medications. Was discharged after 2 days.  She presents today for her initial visit with a chief complaint of moderate fatigue upon minimal exertion. She says this has been present for several weeks with varying levels of severity. She does feel like her fatigue is slowly improving. She has associated dizziness along with this. She denies any chest pain, edema, palpitations, abdominal distention, difficulty sleeping, shortness of breath or weight gain.   Past Medical History:  Diagnosis Date  . CHF (congestive heart failure) (HCC)   . HLD (hyperlipidemia)   . Hypertensive heart disease    a. TTE 1/19: EF 50-55%, no RWMA, Gr1DD, mildly dilated left atrium, PASP could not be estimated; b. MV 1/19: small defect of mild severity present in the apex location felt to be 2/2 breast attenuation, EF 45-54% felt to be 2/2 hypertensive heart disease. This was a normal study  . Obesity    Past Surgical History:  Procedure Laterality Date  . HAND SURGERY Left    Family History  Problem Relation Age of Onset  . Hypertension Mother   . Hypertension Father   . Cancer Father        Thyroid   Social History   Tobacco Use  . Smoking status: Never Smoker  . Smokeless tobacco: Never Used  Substance Use Topics  . Alcohol use: No    Comment: Occassional   No Known Allergies   Prior to Admission medications   Medication Sig Start Date End Date Taking? Authorizing Provider  aspirin EC 81 MG EC tablet Take 1  tablet (81 mg total) by mouth daily. 03/29/17  Yes Shaune Pollackhen, Qing, MD  atorvastatin (LIPITOR) 40 MG tablet Take 1 tablet (40 mg total) by mouth daily at 6 PM. 03/29/17  Yes Shaune Pollackhen, Qing, MD  carvedilol (COREG) 12.5 MG tablet Take 1 tablet (12.5 mg total) by mouth 2 (two) times daily with a meal. 03/29/17  Yes Shaune Pollackhen, Qing, MD  furosemide (LASIX) 40 MG tablet Take 1 tablet (40 mg total) by mouth daily. 03/29/17  Yes Shaune Pollackhen, Qing, MD  irbesartan (AVAPRO) 300 MG tablet Take 1 tablet (300 mg total) by mouth daily. 03/29/17  Yes Shaune Pollackhen, Qing, MD   Review of Systems  Constitutional: Positive for fatigue. Negative for appetite change.  HENT: Negative for congestion, postnasal drip and sore throat.   Eyes: Negative.   Respiratory: Negative for chest tightness and shortness of breath.   Cardiovascular: Negative for chest pain, palpitations and leg swelling.  Gastrointestinal: Negative for abdominal distention and abdominal pain.  Endocrine: Negative.   Genitourinary: Negative.   Musculoskeletal: Negative for back pain and neck pain.  Skin: Negative.   Allergic/Immunologic: Negative.   Neurological: Positive for dizziness and light-headedness.  Hematological: Negative for adenopathy. Does not bruise/bleed easily.  Psychiatric/Behavioral: Negative for dysphoric mood and sleep disturbance. The patient is not nervous/anxious.    Vitals:   04/05/17 1233  BP: (!) 151/80  Pulse: 62  Resp: 18  SpO2: 98%  Weight:  262 lb 4 oz (119 kg)  Height: 5\' 9"  (1.753 m)   Wt Readings from Last 3 Encounters:  04/05/17 262 lb 4 oz (119 kg)  03/29/17 263 lb 3.2 oz (119.4 kg)  08/18/16 270 lb (122.5 kg)   Lab Results  Component Value Date   CREATININE 0.96 03/29/2017   CREATININE 0.69 03/28/2017   CREATININE 0.69 03/27/2017   Physical Exam  Constitutional: She is oriented to person, place, and time. She appears well-developed and well-nourished.  HENT:  Head: Normocephalic and atraumatic.  Neck: Normal range of motion. Neck  supple. No JVD present.  Cardiovascular: Regular rhythm. Bradycardia present.  Pulmonary/Chest: Effort normal. She has no wheezes. She has no rales.  Abdominal: Soft. She exhibits no distension. There is no tenderness.  Musculoskeletal: She exhibits no edema or tenderness.  Neurological: She is alert and oriented to person, place, and time.  Skin: Skin is warm and dry.  Psychiatric: She has a normal mood and affect. Her behavior is normal. Thought content normal.  Nursing note and vitals reviewed.  Assessment & Plan:  1: Chronic heart failure with preserved ejection fraction- - NYHA class III - euvolemic today - weighing daily and home weights reviewed. Instructed to call for an overnight weight gain of >2 pounds or a weekly weight gain of >5 pounds - she did have one overnight weight gain of >2 pounds but looking back over her food log, it may have been due to popcorn consumption - not adding salt to her food and is diligently reading food labels to keep daily sodium consumption to 2000mg  daily; written dietary information was given to her about this - maintain fluid intake to 40-60 ounces of fluid daily - sees cardiology Shea Evans) later today - BNP from 03/27/17 was 83.0 - she does not get the flu vaccine - discussed cardiac rehab - work note given to her until 04/11/17   2: HTN- - BP mildly elevated today - she says that she's only been taking all these medications for the last week since hospital discharge - due to dizziness, will not adjust medications today; continue checking BP's at home - BMP from 03/29/17 reviewed and showed sodium 139, potassium 3.7 and GFR >60  Medication list was reviewed.  Return in 1 month or sooner for any questions/problems before then.

## 2017-04-05 ENCOUNTER — Ambulatory Visit: Payer: Self-pay | Attending: Family | Admitting: Family

## 2017-04-05 ENCOUNTER — Encounter: Payer: Self-pay | Admitting: Physician Assistant

## 2017-04-05 ENCOUNTER — Ambulatory Visit: Payer: Self-pay | Admitting: Physician Assistant

## 2017-04-05 ENCOUNTER — Other Ambulatory Visit: Payer: Self-pay

## 2017-04-05 ENCOUNTER — Encounter: Payer: Self-pay | Admitting: Family

## 2017-04-05 VITALS — BP 151/80 | HR 62 | Resp 18 | Ht 69.0 in | Wt 262.2 lb

## 2017-04-05 VITALS — BP 132/68 | HR 59 | Ht 69.0 in | Wt 262.0 lb

## 2017-04-05 DIAGNOSIS — I5032 Chronic diastolic (congestive) heart failure: Secondary | ICD-10-CM

## 2017-04-05 DIAGNOSIS — E669 Obesity, unspecified: Secondary | ICD-10-CM | POA: Insufficient documentation

## 2017-04-05 DIAGNOSIS — I5189 Other ill-defined heart diseases: Secondary | ICD-10-CM

## 2017-04-05 DIAGNOSIS — I10 Essential (primary) hypertension: Secondary | ICD-10-CM

## 2017-04-05 DIAGNOSIS — I110 Hypertensive heart disease with heart failure: Secondary | ICD-10-CM | POA: Insufficient documentation

## 2017-04-05 DIAGNOSIS — Z7982 Long term (current) use of aspirin: Secondary | ICD-10-CM | POA: Insufficient documentation

## 2017-04-05 DIAGNOSIS — E785 Hyperlipidemia, unspecified: Secondary | ICD-10-CM | POA: Insufficient documentation

## 2017-04-05 DIAGNOSIS — R42 Dizziness and giddiness: Secondary | ICD-10-CM | POA: Insufficient documentation

## 2017-04-05 DIAGNOSIS — Z79899 Other long term (current) drug therapy: Secondary | ICD-10-CM | POA: Insufficient documentation

## 2017-04-05 DIAGNOSIS — I119 Hypertensive heart disease without heart failure: Secondary | ICD-10-CM

## 2017-04-05 DIAGNOSIS — I519 Heart disease, unspecified: Secondary | ICD-10-CM

## 2017-04-05 HISTORY — DX: Essential (primary) hypertension: I10

## 2017-04-05 NOTE — Patient Instructions (Addendum)
Medication Instructions:  Your physician recommends that you continue on your current medications as directed. Please refer to the Current Medication list given to you today.   Labwork: Your physician recommends that you return for lab work in: TODAY (BMET).   Testing/Procedures: none  Follow-Up: You have been referred to Pulmonology for sleep study.   Your physician recommends that you schedule a follow-up appointment in: 2 WEEKS WITH RYAN DUNN, PA-C.   If you need a refill on your cardiac medications before your next appointment, please call your pharmacy.   [facility] - [phone] [address] [place], [place] [id number]

## 2017-04-05 NOTE — Patient Instructions (Signed)
Continue weighing daily and call for an overnight weight gain of > 2 pounds or a weekly weight gain of >5 pounds.  Drink between 40-60 ounces of fluid daily.  

## 2017-04-06 ENCOUNTER — Encounter: Payer: Self-pay | Admitting: Internal Medicine

## 2017-04-06 ENCOUNTER — Ambulatory Visit (INDEPENDENT_AMBULATORY_CARE_PROVIDER_SITE_OTHER): Payer: Self-pay | Admitting: Internal Medicine

## 2017-04-06 VITALS — BP 150/88 | HR 74 | Resp 16 | Wt 262.0 lb

## 2017-04-06 DIAGNOSIS — G4719 Other hypersomnia: Secondary | ICD-10-CM

## 2017-04-06 LAB — BASIC METABOLIC PANEL
BUN / CREAT RATIO: 20 (ref 9–23)
BUN: 17 mg/dL (ref 6–24)
CO2: 25 mmol/L (ref 20–29)
Calcium: 9.5 mg/dL (ref 8.7–10.2)
Chloride: 99 mmol/L (ref 96–106)
Creatinine, Ser: 0.85 mg/dL (ref 0.57–1.00)
GFR calc Af Amer: 87 mL/min/{1.73_m2} (ref >59)
GFR calc non Af Amer: 76 mL/min/{1.73_m2} (ref >59)
GLUCOSE: 89 mg/dL (ref 65–99)
Potassium: 5 mmol/L (ref 3.5–5.2)
Sodium: 142 mmol/L (ref 134–144)

## 2017-04-06 NOTE — Progress Notes (Signed)
[facility] Consultation      Assessment and Plan:  Essential hypertension with chronic diastolic congestive heart failure. Questionable obstructive sleep apnea. -Continue management per cardiology. -Given that the patient currently does not have insurance testing and treatment would be very expensive for her.  Currently she does not have symptoms of excessive daytime sleepiness, and her blood pressure appears to be better controlled with medical therapy. -I have asked her to call us back when she is obtained insurance we would be happy to arrange for sleep apnea testing.  However if her blood pressure proves difficult to control, I would like her to come back and see [place] earlier as then testing would be warranted even if she does not have insurance at that time. -Would otherwise defer sleep apnea testing at this time until the patient obtains insurance, or if blood pressure proves difficult to control with medical therapy.   Date: 04/06/2017  MRN# [id number] Mindy Myers [birth year]    Mindy Myers is a 57 y.o. old female seen in consultation for chief complaint of:    Chief Complaint  Patient presents with  . Advice Only    referred by Eula Listen: Pt states no sleep issues:    HPI:   She was in the hospital recently, her BP was elevated. Her BP has been tough to control, and they are working on getting it controlled.  She denies dyspnea, current chest pain.  She does not know if she snores at night. Her ESS is 3 today. She does not fall asleep during the day.  She denies daytime sleepiness, falling asleep at inappropriate times.  She notes no morning headaches.   PMHX:   Past Medical History:  Diagnosis Date  . CHF (congestive heart failure) (HCC)   . HLD (hyperlipidemia)   . Hypertension   . Hypertensive heart disease    a. TTE 1/19: EF 50-55%, no RWMA, Gr1DD, mildly dilated left atrium, PASP could not be estimated; b. MV 1/19: small defect of mild  severity present in the apex location felt to be 2/2 breast attenuation, EF 45-54% felt to be 2/2 hypertensive heart disease. This was a normal study  . Obesity    Surgical Hx:  Past Surgical History:  Procedure Laterality Date  . HAND SURGERY Left    Family Hx:  Family History  Problem Relation Age of Onset  . Hypertension Mother   . Hypertension Father   . Cancer Father        Thyroid   Social Hx:   Social History   Tobacco Use  . Smoking status: Never Smoker  . Smokeless tobacco: Never Used  Substance Use Topics  . Alcohol use: No    Comment: Occassional  . Drug use: No   Medication:    Current Outpatient Medications:  .  aspirin EC 81 MG EC tablet, Take 1 tablet (81 mg total) by mouth daily., Disp: 30 tablet, Rfl: 2 .  atorvastatin (LIPITOR) 40 MG tablet, Take 1 tablet (40 mg total) by mouth daily at 6 PM., Disp: 30 tablet, Rfl: 1 .  carvedilol (COREG) 12.5 MG tablet, Take 1 tablet (12.5 mg total) by mouth 2 (two) times daily with a meal., Disp: 60 tablet, Rfl: 1 .  furosemide (LASIX) 40 MG tablet, Take 1 tablet (40 mg total) by mouth daily., Disp: 30 tablet, Rfl: 1 .  irbesartan (AVAPRO) 300 MG tablet, Take 1 tablet (300 mg total) by mouth daily., Disp: 30 tablet, Rfl: 1  Allergies:  Patient has no known allergies.  Review of Systems: Gen:  Denies  fever, sweats, chills HEENT: Denies blurred vision, double vision. bleeds, sore throat Cvc:  No dizziness, chest pain. Resp:   Denies cough or sputum production, shortness of breath Gi: Denies swallowing difficulty, stomach pain. Gu:  Denies bladder incontinence, burning urine Ext:   No Joint pain, stiffness. Skin: No skin rash,  hives  Endoc:  No polyuria, polydipsia. Psych: No depression, insomnia. Other:  All other systems were reviewed with the patient and were negative other that what is mentioned in the HPI.   Physical Examination:   VS: BP (!) 150/88 (BP Location: Left Arm, Cuff Size: Large)   Pulse 74    Resp 16   Wt 262 lb (118.8 kg)   SpO2 97%   BMI 38.69 kg/m   General Appearance: No distress  Neuro:without focal findings,  speech normal,  HEENT: PERRLA, EOM intact.   Pulmonary: normal breath sounds, No wheezing.  CardiovascularNormal S1,S2.  No m/r/g.   Abdomen: Benign, Soft, non-tender. Renal:  No costovertebral tenderness  GU:  No performed at this time. Endoc: No evident thyromegaly, no signs of acromegaly. Skin:   warm, no rashes, no ecchymosis  Extremities: normal, no cyanosis, clubbing.  Other findings:    LABORATORY PANEL:   CBC No results for input(s): WBC, HGB, HCT, PLT in the last 168 hours. ------------------------------------------------------------------------------------------------------------------  Chemistries  Recent Labs  Lab 04/05/17 1413  NA 142  K 5.0  CL 99  CO2 25  GLUCOSE 89  BUN 17  CREATININE 0.85  CALCIUM 9.5   ------------------------------------------------------------------------------------------------------------------  Cardiac Enzymes No results for input(s): TROPONINI in the last 168 hours. ------------------------------------------------------------  RADIOLOGY:  No results found.     Thank  you for the consultation and for allowing [facility] to assist in the care of your patient. Our recommendations are noted above.  Please contact us if we can be of further service.   Wells Guileseep Kelcey Wickstrom, MD.  Board Certified in Internal Medicine, Pulmonary Medicine, Critical Care Medicine, and Sleep Medicine.  [facility] Office Number: [phone]  Santiago Gladavid Kasa, M.D.  Billy Fischeravid Simonds, M.D  04/06/2017

## 2017-04-06 NOTE — Patient Instructions (Addendum)
Would recommend that you undergo sleep apnea testing once you have insurance. This should be performed sooner if your blood pressure is difficult to control.

## 2017-04-18 ENCOUNTER — Encounter: Payer: Self-pay | Admitting: Physician Assistant

## 2017-04-18 NOTE — Progress Notes (Signed)
Cardiology Office Note Date:  04/19/2017  Patient ID:  Mindy Myers 12/30/1960, MRN [id number] PCP:  Patient, No Pcp Per  Cardiologist:  Dr. Kirke Corin, MD    Chief Complaint: Follow up HTN  History of Present Illness: Mindy Myers is a 57 y.o. female with history of hypertensive heart disease with chronic diastolic CHF, HLD, and obesity who presents for follow up of hypertension.   She was previously evaluated in 2013 for atypical chest pain, ruled out and underwent nuclear stress testing that was without evidence of ischemia. She reported being diagnosed with HTN years ago and has reportedly been managed with 3 antihypertensives. She had been out of these medications for several months 2/2 finances prior to her recent admission in early 03/2017. She presented to a new PCP on 1/4 and was noted to have BP of 210/124 as well as a UTI. She was started on metoprolol 50 mg bid and Macrobid.   Admitted to [facility] 1/6-1/7 for chest pain and accelerated HTN with an initial BP of 202/86. Cardiac enzymes negative, BNP 83, EKG with sinus rhythm with LVH and repolarization abnormalities. Echo on 03/28/17 showed EF of 50-55%, no RWMA, Gr1DD, mildly dilated left atrium, PASP could not be estimated. Lexiscan Myoview 03/28/2017 showed a small defect of mild severity present in the apex location felt to be 2/2 breast attenuation, EF 45-54% felt to be 2/2 hypertensive heart disease. This was a normal study. She was started on Lipitor, Coreg, irbesartan, and Lasix with a discharge BP of 141/48. In hospital follow up on 04/05/17, she was doing well. Blood pressure log showed systolic readings ranging from 115-152/70s. She was also limiting her Na to 1,200-2,000 mg daily. BP at that visit noted to be 132/68 with a heart rate of 59 bpm and weight of 262 pounds. She was referred to pulmonology for evaluation of sleep study and seen by them on 04/06/17 with deferrment of sleep study until she acquired health  insurance.  She is doing well today.  Blood pressure log from home show systolic readings ranging from the low 100s to max of 140s systolic with most readings being in the 1 teens to 120s systolic.  Continues to limit sodium to [phone] mg daily.  Weight down another 4 pounds.  No further chest pain.  Feels like she is doing well.  Has questions regarding seafood.   Past Medical History:  Diagnosis Date  . Chronic diastolic CHF (congestive heart failure) (HCC)    a. TTE 1/19: EF of 50-55%, no RWMA, Gr1DD, mildly dilated left atrium, PASP could not be estimated  . HLD (hyperlipidemia)   . Hypertension   . Hypertensive heart disease    a. TTE 1/19: EF 50-55%, no RWMA, Gr1DD, mildly dilated left atrium, PASP could not be estimated; b. MV 1/19: small defect of mild severity present in the apex location felt to be 2/2 breast attenuation, EF 45-54% felt to be 2/2 hypertensive heart disease. This was a normal study  . Obesity     Past Surgical History:  Procedure Laterality Date  . HAND SURGERY Left     Current Meds  Medication Sig  . aspirin EC 81 MG EC tablet Take 1 tablet (81 mg total) by mouth daily.  Marland Kitchen atorvastatin (LIPITOR) 40 MG tablet Take 1 tablet (40 mg total) by mouth daily at 6 PM.  . carvedilol (COREG) 12.5 MG tablet Take 1 tablet (12.5 mg total) by mouth 2 (two) times daily with a meal.  .  furosemide (LASIX) 40 MG tablet Take 1 tablet (40 mg total) by mouth daily.  . irbesartan (AVAPRO) 300 MG tablet Take 1 tablet (300 mg total) by mouth daily.    Allergies:   Patient has no known allergies.   Social History:  The patient  reports that  has never smoked. she has never used smokeless tobacco. She reports that she does not drink alcohol or use drugs.   Family History:  The patient's family history includes Cancer in her father; Hypertension in her father and mother.  ROS:   Review of Systems  Constitutional: Negative for chills, diaphoresis, fever, malaise/fatigue and  weight loss.  HENT: Negative for congestion.   Eyes: Negative for discharge and redness.  Respiratory: Negative for cough, hemoptysis, sputum production, shortness of breath and wheezing.   Cardiovascular: Negative for chest pain, palpitations, orthopnea, claudication, leg swelling and PND.  Gastrointestinal: Negative for abdominal pain, blood in stool, heartburn, melena, nausea and vomiting.  Genitourinary: Negative for hematuria.  Musculoskeletal: Negative for falls and myalgias.  Skin: Negative for rash.  Neurological: Negative for dizziness, tingling, tremors, sensory change, speech change, focal weakness, loss of consciousness and weakness.  Endo/Heme/Allergies: Does not bruise/bleed easily.  Psychiatric/Behavioral: Negative for substance abuse. The patient is not nervous/anxious.   All other systems reviewed and are negative.    PHYSICAL EXAM:  VS:  BP 132/80 (BP Location: Left Arm, Patient Position: Sitting, Cuff Size: Normal)   Pulse (!) 58   Ht 5\' 9"  (1.753 m)   Wt 258 lb 12 oz (117.4 kg)   BMI 38.21 kg/m  BMI: Body mass index is 38.21 kg/m.  Physical Exam  Constitutional: She is oriented to person, place, and time. She appears well-developed and well-nourished.  HENT:  Head: Normocephalic and atraumatic.  Eyes: Right eye exhibits no discharge. Left eye exhibits no discharge.  Neck: Normal range of motion. No JVD present.  Cardiovascular: Regular rhythm, S1 normal, S2 normal and normal heart sounds. Bradycardia present. Exam reveals no distant heart sounds, no friction rub, no midsystolic click and no opening snap.  No murmur heard. Pulses:      Posterior tibial pulses are 2+ on the right side, and 2+ on the left side.  Pulmonary/Chest: Effort normal and breath sounds normal. No respiratory distress. She has no decreased breath sounds. She has no wheezes. She has no rales. She exhibits no tenderness.  Abdominal: Soft. She exhibits no distension. There is no tenderness.   Musculoskeletal: She exhibits no edema.  Neurological: She is alert and oriented to person, place, and time.  Skin: Skin is warm and dry. No cyanosis. Nails show no clubbing.  Psychiatric: She has a normal mood and affect. Her speech is normal and behavior is normal. Judgment and thought content normal.     EKG:  Was ordered and interpreted by me today. Shows sinus bradycardia, 58 bpm, LVH, nonspecific st/t changes  Recent Labs: 03/27/2017: B Natriuretic Peptide 83.0; Hemoglobin 13.9; Platelets 394 03/28/2017: Magnesium 1.8; TSH 2.451 04/05/2017: BUN 17; Creatinine, Ser 0.85; Potassium 5.0; Sodium 142  03/29/2017: Cholesterol 186; HDL 37; LDL Cholesterol 123; Total CHOL/HDL Ratio 5.0; Triglycerides 129; VLDL 26   Estimated Creatinine Clearance: 98.7 mL/min (by C-G formula based on SCr of 0.85 mg/dL).   Wt Readings from Last 3 Encounters:  04/19/17 258 lb 12 oz (117.4 kg)  04/06/17 262 lb (118.8 kg)  04/05/17 262 lb (118.8 kg)     Other studies reviewed: Additional studies/records reviewed today include: summarized above  ASSESSMENT AND PLAN:  1. Hypertensive heart disease: Blood pressures continue to be well controlled with systolic readings at home ranging from the low 100s to rare 140s systolic with average readings in the 1 teens to 120s systolic.  She continues to watch her sodium intake, eating between [phone] mg of sodium daily.  Tolerating all medications without issues.  Continue current medications without changes.  2. Diastolic dysfunction: She does not appear grossly volume overloaded at this time.  Continue current medications.  Call for weight gain greater than 3 pounds overnight or 5 pounds in 1 week.  Continue daily weights.  3. Morbid obesity: Weight is down 4 pounds today from most recent clinic visit.  Was recently evaluated by pulmonology for possible sleep apnea.  Sleep study has been deferred given lack of insurance.  Sleep study can be reconsidered once patient  obtains insurance or if blood pressure becomes difficult to manage.  4. HLD: Continue Lipitor.  Plan for fasting lipid panel at next visit.  Disposition: F/u with me in 3 months.  Current medicines are reviewed at length with the patient today.  The patient did not have any concerns regarding medicines.  Signed, Eula Listen, PA-C 04/19/2017 2:54 PM     [facility] [place] [address] [place], [place] [address] [phone]

## 2017-04-19 ENCOUNTER — Encounter: Payer: Self-pay | Admitting: Physician Assistant

## 2017-04-19 ENCOUNTER — Ambulatory Visit: Payer: Self-pay | Admitting: Physician Assistant

## 2017-04-19 VITALS — BP 132/80 | HR 58 | Ht 69.0 in | Wt 258.8 lb

## 2017-04-19 DIAGNOSIS — I119 Hypertensive heart disease without heart failure: Secondary | ICD-10-CM

## 2017-04-19 DIAGNOSIS — I5189 Other ill-defined heart diseases: Secondary | ICD-10-CM

## 2017-04-19 DIAGNOSIS — E785 Hyperlipidemia, unspecified: Secondary | ICD-10-CM

## 2017-04-19 DIAGNOSIS — I519 Heart disease, unspecified: Secondary | ICD-10-CM

## 2017-04-19 MED ORDER — ATORVASTATIN CALCIUM 40 MG PO TABS: 40.0000 mg | ORAL_TABLET | Freq: Every day | ORAL | 11 refills | Status: DC

## 2017-04-19 MED ORDER — IRBESARTAN 300 MG PO TABS: 300.0000 mg | ORAL_TABLET | Freq: Every day | ORAL | 11 refills | Status: DC

## 2017-04-19 MED ORDER — CARVEDILOL 12.5 MG PO TABS: 12.5000 mg | ORAL_TABLET | Freq: Two times a day (BID) | ORAL | 11 refills | Status: DC

## 2017-04-19 MED ORDER — FUROSEMIDE 40 MG PO TABS: 40.0000 mg | ORAL_TABLET | Freq: Every day | ORAL | 11 refills | Status: DC

## 2017-04-19 NOTE — Patient Instructions (Signed)
Medication Instructions: - Your physician recommends that you continue on your current medications as directed. Please refer to the Current Medication list given to you today.  Labwork: - none ordered  Procedures/Testing: - none ordered  Follow-Up: - Your physician recommends that you schedule a follow-up appointment in: 3 months with Eula Listenyan Dunn, PA   Any Additional Special Instructions Will Be Listed Below (If Applicable).     If you need a refill on your cardiac medications before your next appointment, please call your pharmacy.

## 2017-05-03 NOTE — Progress Notes (Signed)
Patient ID: Mindy Myers, female    DOB: 1960/06/19, 57 y.o.   MRN: [id number]  HPI  Ms Mindy Myers is a 57 y/o female with a history of hyperlipidemia, HTN and chronic heart failure.   Echo report from 03/28/17 showed an EF of 50-55%. Lexiscan myoview done 03/28/17 showed no significant perfusion to suggest ischemia or infarct.   Admitted 03/27/17 due to HTN and new onset HF. Cardiology consult was obtained. Initially needed IV hydralazine along with IV diuretics and then transitioned to oral medications. Was discharged after 2 days.  She presents today for a follow-up visit with a chief complaint of minimal fatigue upon moderate exertion. She says this has been present for months with varying levels of severity. She doesn't have any associated symptoms. She denies any shortness of breath, chest pain, edema, palpitations, abdominal distention, dizziness, difficulty sleeping or weight gain.   Past Medical History:  Diagnosis Date  . Chronic diastolic CHF (congestive heart failure) (HCC)    a. TTE 1/19: EF of 50-55%, no RWMA, Gr1DD, mildly dilated left atrium, PASP could not be estimated  . HLD (hyperlipidemia)   . Hypertension   . Hypertensive heart disease    a. TTE 1/19: EF 50-55%, no RWMA, Gr1DD, mildly dilated left atrium, PASP could not be estimated; b. MV 1/19: small defect of mild severity present in the apex location felt to be 2/2 breast attenuation, EF 45-54% felt to be 2/2 hypertensive heart disease. This was a normal study  . Obesity    Past Surgical History:  Procedure Laterality Date  . HAND SURGERY Left    Family History  Problem Relation Age of Onset  . Hypertension Mother   . Hypertension Father   . Cancer Father        Thyroid   Social History   Tobacco Use  . Smoking status: Never Smoker  . Smokeless tobacco: Never Used  Substance Use Topics  . Alcohol use: No    Comment: Occassional   No Known Allergies   Prior to Admission medications   Medication Sig  Start Date End Date Taking? Authorizing Provider  aspirin EC 81 MG EC tablet Take 1 tablet (81 mg total) by mouth daily. 03/29/17  Yes Shaune Pollackhen, Qing, MD  atorvastatin (LIPITOR) 40 MG tablet Take 1 tablet (40 mg total) by mouth daily at 6 PM. 04/19/17  Yes Dunn, Raymon Muttonyan M, PA-C  carvedilol (COREG) 12.5 MG tablet Take 1 tablet (12.5 mg total) by mouth 2 (two) times daily with a meal. 04/19/17  Yes Dunn, Raymon Muttonyan M, PA-C  furosemide (LASIX) 40 MG tablet Take 1 tablet (40 mg total) by mouth daily. 04/19/17  Yes Dunn, Raymon Muttonyan M, PA-C  irbesartan (AVAPRO) 300 MG tablet Take 1 tablet (300 mg total) by mouth daily. 04/19/17  Yes Sondra Bargesunn, Ryan M, PA-C    Review of Systems  Constitutional: Positive for fatigue. Negative for appetite change.  HENT: Negative for congestion, postnasal drip and sore throat.   Eyes: Negative.   Respiratory: Negative for chest tightness and shortness of breath.   Cardiovascular: Negative for chest pain, palpitations and leg swelling.  Gastrointestinal: Negative for abdominal distention and abdominal pain.  Endocrine: Negative.   Genitourinary: Negative.   Musculoskeletal: Negative for back pain and neck pain.  Skin: Negative.   Allergic/Immunologic: Negative.   Neurological: Negative for dizziness and light-headedness.  Hematological: Negative for adenopathy. Does not bruise/bleed easily.  Psychiatric/Behavioral: Negative for dysphoric mood and sleep disturbance. The patient is not nervous/anxious.  Vitals:   05/04/17 0829  BP: (!) 138/56  Pulse: 71  Resp: 18  SpO2: 100%  Weight: 254 lb 2 oz (115.3 kg)  Height: 5\' 9"  (1.753 m)   Wt Readings from Last 3 Encounters:  05/04/17 254 lb 2 oz (115.3 kg)  04/19/17 258 lb 12 oz (117.4 kg)  04/06/17 262 lb (118.8 kg)   Lab Results  Component Value Date   CREATININE 0.85 04/05/2017   CREATININE 0.96 03/29/2017   CREATININE 0.69 03/28/2017    Physical Exam  Constitutional: She is oriented to person, place, and time. She appears  well-developed and well-nourished.  HENT:  Head: Normocephalic and atraumatic.  Neck: Normal range of motion. Neck supple. No JVD present.  Cardiovascular: Normal rate and regular rhythm.  Pulmonary/Chest: Effort normal. She has no wheezes. She has no rales.  Abdominal: Soft. She exhibits no distension. There is no tenderness.  Musculoskeletal: She exhibits no edema or tenderness.  Neurological: She is alert and oriented to person, place, and time.  Skin: Skin is warm and dry.  Psychiatric: She has a normal mood and affect. Her behavior is normal. Thought content normal.  Nursing note and vitals reviewed.  Assessment & Plan:  1: Chronic heart failure with preserved ejection fraction- - NYHA class II - euvolemic today - weighing daily and home weights reviewed. Instructed to call for an overnight weight gain of >2 pounds or a weekly weight gain of >5 pounds - weight down 4 pounds from her last visit - not adding salt to her food and is diligently reading food labels to keep daily sodium consumption to 2000mg  daily - maintain fluid intake to 40-60 ounces of fluid daily - saw cardiology (Dunn) 04/19/17 - BNP from 03/27/17 was 83.0 - she does not get the flu vaccine  2: HTN- - BP looks good today - continue medications at this time - saw PCP Chalmers Guest) 03/25/17 - BMP from 04/05/17 reviewed and showed sodium 142, potassium 5.0 and GFR 76  Medication list was reviewed.  Return in 6 months or sooner for any questions/problems before then.

## 2017-05-04 ENCOUNTER — Encounter: Payer: Self-pay | Admitting: Family

## 2017-05-04 ENCOUNTER — Ambulatory Visit: Payer: Self-pay | Attending: Family | Admitting: Family

## 2017-05-04 VITALS — BP 138/56 | HR 71 | Resp 18 | Ht 69.0 in | Wt 254.1 lb

## 2017-05-04 DIAGNOSIS — I10 Essential (primary) hypertension: Secondary | ICD-10-CM

## 2017-05-04 DIAGNOSIS — E785 Hyperlipidemia, unspecified: Secondary | ICD-10-CM | POA: Insufficient documentation

## 2017-05-04 DIAGNOSIS — I5032 Chronic diastolic (congestive) heart failure: Secondary | ICD-10-CM | POA: Insufficient documentation

## 2017-05-04 DIAGNOSIS — Z9889 Other specified postprocedural states: Secondary | ICD-10-CM | POA: Insufficient documentation

## 2017-05-04 DIAGNOSIS — Z6837 Body mass index (BMI) 37.0-37.9, adult: Secondary | ICD-10-CM | POA: Insufficient documentation

## 2017-05-04 DIAGNOSIS — I110 Hypertensive heart disease with heart failure: Secondary | ICD-10-CM | POA: Insufficient documentation

## 2017-05-04 DIAGNOSIS — Z8249 Family history of ischemic heart disease and other diseases of the circulatory system: Secondary | ICD-10-CM | POA: Insufficient documentation

## 2017-05-04 DIAGNOSIS — E669 Obesity, unspecified: Secondary | ICD-10-CM | POA: Insufficient documentation

## 2017-05-04 DIAGNOSIS — Z808 Family history of malignant neoplasm of other organs or systems: Secondary | ICD-10-CM | POA: Insufficient documentation

## 2017-05-04 DIAGNOSIS — Z79899 Other long term (current) drug therapy: Secondary | ICD-10-CM | POA: Insufficient documentation

## 2017-05-04 DIAGNOSIS — Z7982 Long term (current) use of aspirin: Secondary | ICD-10-CM | POA: Insufficient documentation

## 2017-05-04 NOTE — Patient Instructions (Signed)
Continue weighing daily and call for an overnight weight gain of > 2 pounds or a weekly weight gain of >5 pounds. 

## 2017-06-26 ENCOUNTER — Other Ambulatory Visit: Payer: Self-pay | Admitting: Physician Assistant

## 2017-06-27 NOTE — Telephone Encounter (Signed)
Patient was started on aspirin 81 mg at the discharge on 03/27/2017 by Dr. Hilliard Clarkhen Quing.  Please advise if Eula Listenyan Dunn, PA would like to refill the Aspirin.

## 2017-07-15 NOTE — Progress Notes (Signed)
Cardiology Office Note Date:  07/18/2017  Patient ID:  Mindy Myers, [name] 29-Jan-1961, MRN [id number] PCP:  Patient, No Pcp Per  Cardiologist:  Dr. Kirke Corin, MD    Chief Complaint: Follow up  History of Present Illness: Mindy Myers is a 57 y.o. female with history of hypertensive heart disease with chronic diastolic CHF, HLD, and obesity who presents for follow up of hypertension.   She was previously evaluated in 2013 for atypical chest pain, ruled out and underwent nuclear stress testing that was without evidence of ischemia. She reported being diagnosed with HTN years ago and had reportedly been managed with 3 antihypertensives. She had been out of these medications for several months 2/2 finances prior to her recent admission in early 03/2017. She presented to a new PCP on 1/4 and was noted to have BP of 210/124 as well as a UTI. She was started on metoprolol 50 mg bid and Macrobid.   Admitted to [facility] 1/6-1/7 for chest pain and accelerated HTN with an initial BP of 202/86. Cardiac enzymes negative, BNP 83, EKG with sinus rhythm with LVH and repolarization abnormalities. Echo 03/28/17 showed EF of 50-55%, no RWMA, Gr1DD, mildly dilated left atrium, PASP could not be estimated. Lexiscan Myoview 03/28/2017 showed a small defect of mild severity present in the apex location felt to be 2/2 breast attenuation, EF 45-54%, felt to be 2/2 hypertensive heart disease. This was a normal study. She was started on Lipitor, Coreg, irbesartan, and Lasix with a discharge BP of 141/48. In hospital follow up on 04/05/17, she was doing well. Blood pressure log showed systolic readings ranging from 115-152/70s. She was also limiting her Na to 1,200-2,000 mg daily. BP at that visit noted to be 132/68 with a heart rate of 59 bpm and weight of 262 pounds. She was referred to pulmonology for evaluation of sleep study and seen by them on 04/06/17 with deferrment of sleep study until she acquired health insurance. In follow  up on 04/19/17, she was doing well. BP log from home show systolic readings ranging from the low 100s to max of 140s with most readings being in the 1 teens to 120s systolic. She continued to limit sodium. BP was 132/80. Weight down another 4 pounds to 258 pounds. She was seen by the [facility] on 05/04/17 with BP noted to be 138/56, weight down to 254 pounds. No changes were made.   She comes in doing well today. Her only concern is cold fingers while at work. 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 Home BP log shows BP readings from the 130s to low 100s systolic with an occasional systolic reading in the 80s to 90s. She does note some dizziness when her BP is ob the lower side. Sodium intake running between [phone] mg daily. No chest pain or SOB. Weight is down another 17 pounds.    Past Medical History:  Diagnosis Date  . Chronic diastolic CHF (congestive heart failure) (HCC)    a. TTE 1/19: EF of 50-55%, no RWMA, Gr1DD, mildly dilated left atrium, PASP could not be estimated  . HLD (hyperlipidemia)   . Hypertension   . Hypertensive heart disease    a. TTE 1/19: EF 50-55%, no RWMA, Gr1DD, mildly dilated left atrium, PASP could not be estimated; b. MV 1/19: small defect of mild severity present in the apex location felt to be 2/2 breast attenuation, EF 45-54% felt to  be 2/2 hypertensive heart disease. This was a normal study  . Obesity     Past Surgical History:  Procedure Laterality Date  . HAND SURGERY Left     Current Meds  Medication Sig  . aspirin 81 MG EC tablet TAKE 1 TABLET BY MOUTH EVERY DAY  . atorvastatin (LIPITOR) 40 MG tablet Take 1 tablet (40 mg total) by mouth daily at 6 PM.  . carvedilol (COREG) 12.5 MG tablet Take 1 tablet (12.5 mg total) by mouth 2 (two) times daily with a meal.  . furosemide (LASIX) 40 MG tablet Take 1 tablet (40 mg total) by mouth daily.  . irbesartan (AVAPRO) 300 MG  tablet Take 1 tablet (300 mg total) by mouth daily.    Allergies:   Patient has no known allergies.   Social History:  The patient  reports that she has never smoked. She has never used smokeless tobacco. She reports that she does not drink alcohol or use drugs.   Family History:  The patient's family history includes Cancer in her father; Hypertension in her father and mother.  ROS:   Review of Systems  Constitutional: Positive for malaise/fatigue. Negative for chills, diaphoresis, fever and weight loss.  HENT: Negative for congestion.   Eyes: Negative for discharge and redness.  Respiratory: Negative for cough, hemoptysis, sputum production, shortness of breath and wheezing.   Cardiovascular: Negative for chest pain, palpitations, orthopnea, claudication, leg swelling and PND.  Gastrointestinal: Negative for abdominal pain, blood in stool, heartburn, melena, nausea and vomiting.  Genitourinary: Negative for hematuria.  Musculoskeletal: Negative for falls and myalgias.  Skin: Negative for rash.  Neurological: Positive for sensory change. Negative for dizziness, tingling, tremors, speech change, focal weakness, loss of consciousness and weakness.       Cold intolerance of the bilateral fingers.   Endo/Heme/Allergies: Does not bruise/bleed easily.  Psychiatric/Behavioral: Negative for substance abuse. The patient is not nervous/anxious.      PHYSICAL EXAM:  VS:  BP 140/78 (BP Location: Left Arm, Patient Position: Sitting, Cuff Size: Normal)   Pulse (!) 47   Ht 5\' 9"  (1.753 m)   Wt 241 lb (109.3 kg)   BMI 35.59 kg/m  BMI: Body mass index is 35.59 kg/m.  Physical Exam  Constitutional: She is oriented to person, place, and time. She appears well-developed and well-nourished.  HENT:  Head: Normocephalic and atraumatic.  Eyes: Right eye exhibits no discharge. Left eye exhibits no discharge.  Neck: Normal range of motion. No JVD present.  Cardiovascular: Normal rate, regular rhythm,  S1 normal, S2 normal and normal heart sounds. Exam reveals no distant heart sounds, no friction rub, no midsystolic click and no opening snap.  No murmur heard. Pulses:      Radial pulses are 2+ on the right side, and 2+ on the left side.       Posterior tibial pulses are 2+ on the right side, and 2+ on the left side.  Pulmonary/Chest: Effort normal and breath sounds normal. No respiratory distress. She has no decreased breath sounds. She has no wheezes. She has no rales. She exhibits no tenderness.  Abdominal: Soft. She exhibits no distension. There is no tenderness.  Musculoskeletal: She exhibits no edema.  Neurological: She is alert and oriented to person, place, and time.  Skin: Skin is warm and dry. No cyanosis. Nails show no clubbing.  Psychiatric: She has a normal mood and affect. Her speech is normal and behavior is normal. Judgment and thought content normal.  EKG:  Was ordered and interpreted by me today. Shows sinus bradycardia, 47 bpm, LVH, TWI aVL  Recent Labs: 03/27/2017: B Natriuretic Peptide 83.0; Hemoglobin 13.9; Platelets 394 03/28/2017: Magnesium 1.8; TSH 2.451 04/05/2017: BUN 17; Creatinine, Ser 0.85; Potassium 5.0; Sodium 142  03/29/2017: Cholesterol 186; HDL 37; LDL Cholesterol 123; Total CHOL/HDL Ratio 5.0; Triglycerides 129; VLDL 26   CrCl cannot be calculated (Patient's most recent lab result is older than the maximum 21 days allowed.).   Wt Readings from Last 3 Encounters:  07/18/17 241 lb (109.3 kg)  05/04/17 254 lb 2 oz (115.3 kg)  04/19/17 258 lb 12 oz (117.4 kg)     Other studies reviewed: Additional studies/records reviewed today include: summarized above  ASSESSMENT AND PLAN:  1. Hypertensive heart disease: Blood pressure is mildly elevated today, though her home BP log shows well controlled BP readings, to an occasional soft BP reading. Taper off Coreg as below followed by starting of verapamil as below. Continue irbesartan and Lasix.    2. Diastolic  dysfunction: She does not appear grossly volume up today. Continue current medications.   3. Bradycardia: Taper Coreg to 6.25 mg bid through 5/1 then discontinue. On 5/2, she will start verapamil 180 mg daily in an effort to potentially help with possible Raynaud's. Home heart rates have mostly been in the 60s bpm.  4. 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 Start verapamil as above. Good radial pulse.   5. Morbid obesity: Weight is down 17 pounds from her last office visit (258--241 pounds). Sleep study has been deferred by pulmonology.   6. HLD: Check lipid and liver function. Continue Lipitor 40 mg daily.   Disposition: F/u with me in 1 month.   Current medicines are reviewed at length with the patient today.  The patient did not have any concerns regarding medicines.  Signed, Eula Listen, PA-C 07/18/2017 1:02 PM     [facility] [place] [address] [place], [place] [address] [phone]

## 2017-07-18 ENCOUNTER — Ambulatory Visit: Payer: Self-pay | Admitting: Physician Assistant

## 2017-07-18 ENCOUNTER — Encounter: Payer: Self-pay | Admitting: Physician Assistant

## 2017-07-18 VITALS — BP 140/78 | HR 47 | Ht 69.0 in | Wt 241.0 lb

## 2017-07-18 DIAGNOSIS — R209 Unspecified disturbances of skin sensation: Secondary | ICD-10-CM

## 2017-07-18 DIAGNOSIS — I119 Hypertensive heart disease without heart failure: Secondary | ICD-10-CM

## 2017-07-18 DIAGNOSIS — R001 Bradycardia, unspecified: Secondary | ICD-10-CM

## 2017-07-18 DIAGNOSIS — E785 Hyperlipidemia, unspecified: Secondary | ICD-10-CM

## 2017-07-18 DIAGNOSIS — I5189 Other ill-defined heart diseases: Secondary | ICD-10-CM

## 2017-07-18 MED ORDER — VERAPAMIL HCL ER 180 MG PO TBCR: 180.0000 mg | EXTENDED_RELEASE_TABLET | Freq: Every day | ORAL | 11 refills | Status: DC

## 2017-07-18 NOTE — Patient Instructions (Signed)
Medication Instructions: - Your physician has recommended you make the following change in your medication:   1) DECREASE coreg (carvedilol) 12.5 mg- take 1/2 tablet (6.25 mg) TWICE daily through 07/20/17, then stop  2) on 07/21/17- START verapamil 180 mg- take 1 tablet by mouth ONCE daily  Labwork: - Your physician recommends that you have lab work today: CMET/ Lipid  Procedures/Testing: - none ordered  Follow-Up: - Your physician recommends that you schedule a follow-up appointment in: 1 month with Eula Listen, PA.   Any Additional Special Instructions Will Be Listed Below (If Applicable).     If you need a refill on your cardiac medications before your next appointment, please call your pharmacy.

## 2017-07-19 LAB — COMPREHENSIVE METABOLIC PANEL
ALBUMIN: 4 g/dL (ref 3.5–5.5)
ALK PHOS: 93 IU/L (ref 39–117)
ALT: 13 IU/L (ref 0–32)
AST: 16 IU/L (ref 0–40)
Albumin/Globulin Ratio: 1.5 (ref 1.2–2.2)
BUN / CREAT RATIO: 18 (ref 9–23)
BUN: 17 mg/dL (ref 6–24)
Bilirubin Total: 0.7 mg/dL (ref 0.0–1.2)
CALCIUM: 9 mg/dL (ref 8.7–10.2)
CO2: 23 mmol/L (ref 20–29)
CREATININE: 0.93 mg/dL (ref 0.57–1.00)
Chloride: 103 mmol/L (ref 96–106)
GFR calc Af Amer: 78 mL/min/{1.73_m2} (ref >59)
GFR calc non Af Amer: 68 mL/min/{1.73_m2} (ref >59)
GLOBULIN, TOTAL: 2.7 g/dL (ref 1.5–4.5)
GLUCOSE: 90 mg/dL (ref 65–99)
Potassium: 4.8 mmol/L (ref 3.5–5.2)
SODIUM: 143 mmol/L (ref 134–144)
TOTAL PROTEIN: 6.7 g/dL (ref 6.0–8.5)

## 2017-07-19 LAB — LIPID PANEL
CHOL/HDL RATIO: 3.2 ratio (ref 0.0–4.4)
CHOLESTEROL TOTAL: 117 mg/dL (ref 100–199)
HDL: 37 mg/dL — ABNORMAL LOW (ref >39)
LDL Calculated: 60 mg/dL (ref 0–99)
Triglycerides: 99 mg/dL (ref 0–149)
VLDL CHOLESTEROL CAL: 20 mg/dL (ref 5–40)

## 2017-08-18 NOTE — Progress Notes (Signed)
Cardiology Office Note Date:  08/19/2017  Patient ID:  Mindy, Myers 1960-07-09, MRN [id number] PCP:  Patient, No Pcp Per  Cardiologist:  Dr. Kirke Corin, MD    Chief Complaint: Follow up  History of Present Illness: Mindy Myers is a 57 y.o. female with history of hypertensive heart disease with chronic diastolic CHF, HLD, and obesity who presents for follow up of hypertension.  She was previously evaluated in 2013 for atypical chest pain, ruled out and underwent nuclear stress testing that was without evidence of ischemia. She reported being diagnosed with HTN years ago and had reportedly been managed with 3 antihypertensives. She had been out of these medications for several months 2/2 finances prior to her recent admission in early 03/2017. She presented to a new PCP on 1/4 and was noted to have BP of 210/124 as well as a UTI. She was started on metoprolol 50 mg bid and Macrobid.   Admitted to [facility] 1/6-1/7 for chest pain and accelerated HTN with an initial BP of 202/86. Cardiac enzymes negative, BNP 83, EKG with sinus rhythm with LVH and repolarization abnormalities. Echo 03/28/17 showed EF of 50-55%, no RWMA, Gr1DD, mildly dilated left atrium, PASP could not be estimated. Lexiscan Myoview 03/28/2017 showed a small defect of mild severity present in the apex location felt to be 2/2 breast attenuation, EF 45-54%, felt to be 2/2 hypertensive heart disease. This was a normal study. She was started on Lipitor, Coreg, irbesartan, and Lasix with a discharge BP of 141/48. In hospital follow up on 04/05/17, she was doing well. Blood pressure log showed systolic readings ranging from 115-152/70s. She was also limiting her Na to 1,200-2,000 mg daily. BP at that visit noted to be 132/68 with a heart rate of 59 bpm and weight of 262 pounds. She was referred to pulmonology for evaluation of sleep study and seen by them on 04/06/17 with deferrment of sleep study until she acquired health insurance. In follow up  on 04/19/17, she was doing well. BP log from home show systolic readings ranging from the low 100s to max of 140s with most readings being in the 1 teens to 120s systolic. She continued to limit sodium. BP was 132/80. Weight down another 4 pounds to 258 pounds. She was seen by the [facility] on 05/04/17 with BP noted to be 138/56, weight down to 254 pounds. No changes were made.  She was most recently seen by myself on 07/18/2017 and was doing well.  Blood pressure continue to range from the low 100s to 130s systolic with an occasional systolic reading in the 80s to 90s.  She noted mild dizziness when her BP was on the lower side.  She continued to limit her sodium intake.  Weight was down another 17 pounds.  Her main concern was cold fingers while at work.  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    Due to her occasional soft BP readings and noted bradycardia she was tapered off carvedilol and started on verapamil in the setting of hypertension and possible Raynaud's.  She comes in doing well today.  Blood pressure continues to be well controlled at home and more stabilized since starting verapamil after her last visit.  Readings typically range from the low 100s to 120s systolic.  Max reading of 146 systolic.  Low reading of 86 systolic.  Her low reading was in the setting of poor p.o. intake.  Her  cold fingers have improved since starting verapamil.  She continues to eat a low-sodium diet.  No chest pain, shortness of breath, diaphoresis, nausea, vomiting, dizziness, presyncope, or syncope.  She does not have any issues or concerns today.  Past Medical History:  Diagnosis Date  . Chronic diastolic CHF (congestive heart failure) (HCC)    a. TTE 1/19: EF of 50-55%, no RWMA, Gr1DD, mildly dilated left atrium, PASP could not be estimated  . HLD (hyperlipidemia)   . Hypertension   . Hypertensive heart disease    a. TTE 1/19: EF 50-55%, no  RWMA, Gr1DD, mildly dilated left atrium, PASP could not be estimated; b. MV 1/19: small defect of mild severity present in the apex location felt to be 2/2 breast attenuation, EF 45-54% felt to be 2/2 hypertensive heart disease. This was a normal study  . Obesity     Past Surgical History:  Procedure Laterality Date  . HAND SURGERY Left     Current Meds  Medication Sig  . aspirin 81 MG EC tablet TAKE 1 TABLET BY MOUTH EVERY DAY  . atorvastatin (LIPITOR) 40 MG tablet Take 1 tablet (40 mg total) by mouth daily at 6 PM.  . furosemide (LASIX) 40 MG tablet Take 1 tablet (40 mg total) by mouth daily.  . irbesartan (AVAPRO) 300 MG tablet Take 1 tablet (300 mg total) by mouth daily.  . verapamil (CALAN-SR) 180 MG CR tablet Take 1 tablet (180 mg total) by mouth daily.    Allergies:   Patient has no known allergies.   Social History:  The patient  reports that she has never smoked. She has never used smokeless tobacco. She reports that she does not drink alcohol or use drugs.   Family History:  The patient's family history includes Cancer in her father; Hypertension in her father and mother.  ROS:   Review of Systems  Constitutional: Negative for chills, diaphoresis, fever, malaise/fatigue and weight loss.  HENT: Negative for congestion.   Eyes: Negative for discharge and redness.  Respiratory: Negative for cough, hemoptysis, sputum production, shortness of breath and wheezing.   Cardiovascular: Negative for chest pain, palpitations, orthopnea, claudication, leg swelling and PND.  Gastrointestinal: Negative for abdominal pain, blood in stool, heartburn, melena, nausea and vomiting.  Genitourinary: Negative for hematuria.  Musculoskeletal: Negative for falls and myalgias.  Skin: Negative for rash.  Neurological: Negative for dizziness, tingling, tremors, sensory change, speech change, focal weakness, loss of consciousness and weakness.       Improved cold sensation of the fingers.    Endo/Heme/Allergies: Does not bruise/bleed easily.  Psychiatric/Behavioral: Negative for substance abuse. The patient is not nervous/anxious.   All other systems reviewed and are negative.    PHYSICAL EXAM:  VS:  BP 110/60 (BP Location: Left Arm, Patient Position: Sitting, Cuff Size: Normal)   Pulse 76   Ht  (1.753 m)   Wt 243 lb (110.2 kg)   SpO2 99%   BMI 35.88 kg/m  BMI: Body mass index is 35.88 kg/m.  Physical Exam  Constitutional: She is oriented to person, place, and time. She appears well-developed and well-nourished.  HENT:  Head: Normocephalic and atraumatic.  Eyes: Right eye exhibits no discharge. Left eye exhibits no discharge.  Neck: Normal range of motion. No JVD present.  Cardiovascular: Normal rate, regular rhythm, S1 normal, S2 normal and normal heart sounds. Exam reveals no distant heart sounds, no friction rub, no midsystolic click and no opening snap.  No murmur heard. Pulses:  Posterior tibial pulses are 2+ on the right side, and 2+ on the left side.  Pulmonary/Chest: Effort normal and breath sounds normal. No respiratory distress. She has no decreased breath sounds. She has no wheezes. She has no rales. She exhibits no tenderness.  Abdominal: Soft. She exhibits no distension. There is no tenderness.  Musculoskeletal: She exhibits no edema.  Neurological: She is alert and oriented to person, place, and time.  Skin: Skin is warm and dry. No cyanosis. Nails show no clubbing.  Psychiatric: She has a normal mood and affect. Her speech is normal and behavior is normal. Judgment and thought content normal.     EKG:  Was not ordered today.    Recent Labs: 03/27/2017: B Natriuretic Peptide 83.0; Hemoglobin 13.9; Platelets 394 03/28/2017: Magnesium 1.8; TSH 2.451 07/18/2017: ALT 13; BUN 17; Creatinine, Ser 0.93; Potassium 4.8; Sodium 143  03/29/2017: VLDL 26 07/18/2017: Chol/HDL Ratio 3.2; Cholesterol, Total 117; HDL 37; LDL Calculated 60; Triglycerides 99    CrCl cannot be calculated (Patient's most recent lab result is older than the maximum 21 days allowed.).   Wt Readings from Last 3 Encounters:  08/19/17 243 lb (110.2 kg)  07/18/17 241 lb (109.3 kg)  05/04/17 254 lb 2 oz (115.3 kg)     Other studies reviewed: Additional studies/records reviewed today include: summarized above  ASSESSMENT AND PLAN:  1. Hypertensive heart disease: Blood pressure remains well controlled.  Continue irbesartan 300 mg daily, verapamil 180 mg daily, and Lasix 40 mg daily.  Continue heart healthy diet with low sodium intake.  2. Diastolic dysfunction: She does not appear volume overloaded at this time.  Continue Lasix.  Check CMP as below.  3. Possible Raynaud's phenomena: Improved since starting calcium channel blocker.  Continue as above.  4. Hyperlipidemia: Patient is fasting today.  Check lipid and CMP.  Continue Lipitor 40 mill grams daily.  Continue heart healthy diet.  5. Morbid obesity: Continue weight loss is advised.  Sleep study has been deferred by pulmonology.  Disposition: F/u with Dr. Kirke Corin in 3 to 4 months.  Current medicines are reviewed at length with the patient today.  The patient did not have any concerns regarding medicines.  Signed, Eula Listen, PA-C 08/19/2017 11:58 AM     [facility] [address] [place], [place] [address] [phone]

## 2017-08-19 ENCOUNTER — Ambulatory Visit: Payer: Self-pay | Admitting: Physician Assistant

## 2017-08-19 ENCOUNTER — Encounter: Payer: Self-pay | Admitting: Physician Assistant

## 2017-08-19 VITALS — BP 110/60 | HR 76 | Ht 69.0 in | Wt 239.5 lb

## 2017-08-19 DIAGNOSIS — I7300 Raynaud's syndrome without gangrene: Secondary | ICD-10-CM

## 2017-08-19 DIAGNOSIS — E785 Hyperlipidemia, unspecified: Secondary | ICD-10-CM

## 2017-08-19 DIAGNOSIS — I5189 Other ill-defined heart diseases: Secondary | ICD-10-CM

## 2017-08-19 DIAGNOSIS — I119 Hypertensive heart disease without heart failure: Secondary | ICD-10-CM

## 2017-08-19 MED ORDER — ASPIRIN 81 MG PO TBEC: 81.0000 mg | DELAYED_RELEASE_TABLET | Freq: Every day | ORAL | 3 refills | Status: DC

## 2017-08-19 NOTE — Patient Instructions (Signed)
Medication Instructions:  Your physician recommends that you continue on your current medications as directed. Please refer to the Current Medication list given to you today.   Labwork: Your physician recommends that you return for lab work in: TODAY - CMET, LIPID.   Testing/Procedures: NONE  Follow-Up: Your physician recommends that you schedule a follow-up appointment in: 3-4 MONTHS WITH DR ARIDA AT NEXT AVAILABLE.  If you need a refill on your cardiac medications before your next appointment, please call your pharmacy.

## 2017-08-20 LAB — COMPREHENSIVE METABOLIC PANEL
A/G RATIO: 1.5 (ref 1.2–2.2)
ALBUMIN: 4 g/dL (ref 3.5–5.5)
ALT: 16 IU/L (ref 0–32)
AST: 18 IU/L (ref 0–40)
Alkaline Phosphatase: 92 IU/L (ref 39–117)
BUN / CREAT RATIO: 20 (ref 9–23)
BUN: 20 mg/dL (ref 6–24)
Bilirubin Total: 0.6 mg/dL (ref 0.0–1.2)
CALCIUM: 9.1 mg/dL (ref 8.7–10.2)
CO2: 26 mmol/L (ref 20–29)
CREATININE: 0.99 mg/dL (ref 0.57–1.00)
Chloride: 104 mmol/L (ref 96–106)
GFR calc Af Amer: 73 mL/min/{1.73_m2} (ref >59)
GFR, EST NON AFRICAN AMERICAN: 63 mL/min/{1.73_m2} (ref >59)
GLOBULIN, TOTAL: 2.6 g/dL (ref 1.5–4.5)
Glucose: 80 mg/dL (ref 65–99)
POTASSIUM: 4.6 mmol/L (ref 3.5–5.2)
SODIUM: 141 mmol/L (ref 134–144)
Total Protein: 6.6 g/dL (ref 6.0–8.5)

## 2017-08-20 LAB — LIPID PANEL
CHOL/HDL RATIO: 2.9 ratio (ref 0.0–4.4)
Cholesterol, Total: 125 mg/dL (ref 100–199)
HDL: 43 mg/dL (ref >39)
LDL Calculated: 61 mg/dL (ref 0–99)
TRIGLYCERIDES: 103 mg/dL (ref 0–149)
VLDL Cholesterol Cal: 21 mg/dL (ref 5–40)

## 2017-09-23 ENCOUNTER — Telehealth: Payer: Self-pay | Admitting: Physician Assistant

## 2017-09-23 ENCOUNTER — Other Ambulatory Visit: Payer: Self-pay

## 2017-09-23 DIAGNOSIS — I10 Essential (primary) hypertension: Secondary | ICD-10-CM

## 2017-09-23 MED ORDER — IRBESARTAN 300 MG PO TABS: 300.0000 mg | ORAL_TABLET | Freq: Every day | ORAL | 11 refills | Status: DC

## 2017-09-23 MED ORDER — IRBESARTAN 300 MG PO TABS: 300.0000 mg | ORAL_TABLET | Freq: Every day | ORAL | 3 refills | Status: DC

## 2017-09-23 MED ORDER — LISINOPRIL 20 MG PO TABS: 20.0000 mg | ORAL_TABLET | Freq: Every day | ORAL | 11 refills | Status: DC

## 2017-09-23 NOTE — Telephone Encounter (Signed)
Pt called back stating [facility] until January 2020 for the medication. Pt states she has two pills. Should she stop taking the medication or is there a different prescription that can be sent in. Please advise.

## 2017-09-23 NOTE — Telephone Encounter (Signed)
Error

## 2017-09-23 NOTE — Telephone Encounter (Signed)
Can you please contact patient and make her aware of Mindy Listenyan Dunn, PA recommendations.

## 2017-09-23 NOTE — Telephone Encounter (Signed)
Spoke with patient regarding patients Irbesartan 300MG .  [facility] states [facility] all have this on back order until January.  Patient states she only has one day left of this medication.  Can you please advise.

## 2017-09-23 NOTE — Telephone Encounter (Signed)
Pt calling [facility] [place] [facility] medications  They told her it was on back order and it would be even months before they had it back in   Would like to know what alternative can she take  Irbesartan 300 mg is what she is asking about  Please call back

## 2017-09-23 NOTE — Telephone Encounter (Signed)
Pt calling stating she is at the pharmacy [facility] and they do not have the medication  Please advise

## 2017-09-23 NOTE — Telephone Encounter (Signed)
Spoke with patient and reviewed Ryan's recommendations. Reviewed that labs have been ordered and to [facility] to have them done one week from the start of her new medication and to keep follow up in August. She verbalized understanding of our conversation, agreement with plan, and had no further questions at this time.

## 2017-09-23 NOTE — Telephone Encounter (Signed)
Spoke with patient and advised that [facility] had prescription in stock. Prescription sent over to them for her to pick up. She was appreciative for the help with no further questions at this time.

## 2017-09-23 NOTE — Addendum Note (Signed)
Addended by: Bryna ColanderALLEN, Myliah Medel S on: 09/23/2017 03:24 PM   Modules accepted: Orders

## 2017-09-23 NOTE — Telephone Encounter (Signed)
Spoke with patient and she reports that when she went to pick up her medication it was on backorder and they were unable to fill it at this time. Advised that I would reach out to other pharmacies to see if I can find it for her and if not I would check with provider for another medication. She was appreciative for the call and had no further questions at this time.

## 2017-09-23 NOTE — Telephone Encounter (Signed)
Please change patient's irbesartan 300 mg daily to lisinopril 20 mg daily (equivalent dosing). Can send in either 30 days or 90s day with refill for a year, whichever she prefers. Needs a bmet 1 week after starting ACEi. Keep follow up in August.

## 2017-10-03 ENCOUNTER — Other Ambulatory Visit
Admission: RE | Admit: 2017-10-03 | Discharge: 2017-10-03 | Disposition: A | Discharge status: Home / Self Care | Payer: PRIVATE HEALTH INSURANCE | Source: Ambulatory Visit | Attending: Physician Assistant | Admitting: Physician Assistant

## 2017-10-03 DIAGNOSIS — I10 Essential (primary) hypertension: Secondary | ICD-10-CM | POA: Insufficient documentation

## 2017-10-03 LAB — BASIC METABOLIC PANEL
ANION GAP: 8 (ref 5–15)
BUN: 20 mg/dL (ref 6–20)
CALCIUM: 8.8 mg/dL — AB (ref 8.9–10.3)
CO2: 28 mmol/L (ref 22–32)
Chloride: 107 mmol/L (ref 98–111)
Creatinine, Ser: 0.99 mg/dL (ref 0.44–1.00)
Glucose, Bld: 108 mg/dL — ABNORMAL HIGH (ref 70–99)
Potassium: 3.8 mmol/L (ref 3.5–5.1)
SODIUM: 143 mmol/L (ref 135–145)

## 2017-11-02 ENCOUNTER — Encounter: Payer: Self-pay | Admitting: Family

## 2017-11-02 ENCOUNTER — Ambulatory Visit: Payer: PRIVATE HEALTH INSURANCE | Attending: Family | Admitting: Family

## 2017-11-02 VITALS — BP 112/58 | HR 71 | Resp 18 | Ht 69.0 in | Wt 238.1 lb

## 2017-11-02 DIAGNOSIS — E785 Hyperlipidemia, unspecified: Secondary | ICD-10-CM | POA: Diagnosis not present

## 2017-11-02 DIAGNOSIS — Z7982 Long term (current) use of aspirin: Secondary | ICD-10-CM | POA: Diagnosis not present

## 2017-11-02 DIAGNOSIS — E669 Obesity, unspecified: Secondary | ICD-10-CM | POA: Diagnosis not present

## 2017-11-02 DIAGNOSIS — Z8249 Family history of ischemic heart disease and other diseases of the circulatory system: Secondary | ICD-10-CM | POA: Diagnosis not present

## 2017-11-02 DIAGNOSIS — Z79899 Other long term (current) drug therapy: Secondary | ICD-10-CM | POA: Insufficient documentation

## 2017-11-02 DIAGNOSIS — I110 Hypertensive heart disease with heart failure: Secondary | ICD-10-CM | POA: Insufficient documentation

## 2017-11-02 DIAGNOSIS — I5032 Chronic diastolic (congestive) heart failure: Secondary | ICD-10-CM | POA: Insufficient documentation

## 2017-11-02 DIAGNOSIS — I10 Essential (primary) hypertension: Secondary | ICD-10-CM

## 2017-11-02 NOTE — Patient Instructions (Signed)
Continue weighing daily and call for an overnight weight gain of > 2 pounds or a weekly weight gain of >5 pounds. 

## 2017-11-02 NOTE — Progress Notes (Signed)
Patient ID: Reginal LutesKaren L Pasternak, female    DOB: 1961/01/10, 57 y.o.   MRN: [id number]  HPI  [name] is a 57 y/o female with a history of hyperlipidemia, HTN and chronic heart failure.   Echo report from 03/28/17 showed an EF of 50-55%. Lexiscan myoview done 03/28/17 showed no significant perfusion to suggest ischemia or infarct.   Has not been admitted or been in the ED in the last 6 months.   She presents today for a follow-up visit with a chief complaint of minimal fatigue upon moderate exertion. She says that this has been present for several years. She has no other associated symptoms. She currently denies any difficulty sleeping, abdominal distention, palpitations, pedal edema, chest pain shortness of breath, dizziness or weight gain. Has been walking some at home and is on her feet 12-14 hours a day at work and has lost weight since last here.   Past Medical History:  Diagnosis Date  . Chronic diastolic CHF (congestive heart failure) (HCC)    a. TTE 1/19: EF of 50-55%, no RWMA, Gr1DD, mildly dilated left atrium, PASP could not be estimated  . HLD (hyperlipidemia)   . Hypertension   . Hypertensive heart disease    a. TTE 1/19: EF 50-55%, no RWMA, Gr1DD, mildly dilated left atrium, PASP could not be estimated; b. MV 1/19: small defect of mild severity present in the apex location felt to be 2/2 breast attenuation, EF 45-54% felt to be 2/2 hypertensive heart disease. This was a normal study  . Obesity    Past Surgical History:  Procedure Laterality Date  . HAND SURGERY Left    Family History  Problem Relation Age of Onset  . Hypertension Mother   . Hypertension Father   . Cancer Father        Thyroid   Social History   Tobacco Use  . Smoking status: Never Smoker  . Smokeless tobacco: Never Used  Substance Use Topics  . Alcohol use: No    Comment: Occassional   No Known Allergies   Prior to Admission medications   Medication Sig Start Date End Date Taking? Authorizing  Provider  aspirin 81 MG EC tablet Take 1 tablet (81 mg total) by mouth daily. Swallow whole. 08/19/17  Yes Dunn, Raymon Muttonyan M, PA-C  atorvastatin (LIPITOR) 40 MG tablet Take 1 tablet (40 mg total) by mouth daily at 6 PM. 04/19/17  Yes Dunn, Raymon Muttonyan M, PA-C  furosemide (LASIX) 40 MG tablet Take 1 tablet (40 mg total) by mouth daily. 04/19/17  Yes Dunn, Raymon Muttonyan M, PA-C  lisinopril (PRINIVIL,ZESTRIL) 20 MG tablet Take 1 tablet (20 mg total) by mouth daily. 09/23/17 12/22/17 Yes Dunn, Raymon Muttonyan M, PA-C  verapamil (CALAN-SR) 180 MG CR tablet Take 1 tablet (180 mg total) by mouth daily. 07/18/17 07/18/18 Yes Dunn, Raymon Muttonyan M, PA-C    Review of Systems  Constitutional: Positive for fatigue. Negative for appetite change.  HENT: Negative for congestion, postnasal drip and sore throat.   Eyes: Negative.   Respiratory: Negative for chest tightness and shortness of breath.   Cardiovascular: Negative for chest pain, palpitations and leg swelling.  Gastrointestinal: Negative for abdominal distention and abdominal pain.  Endocrine: Negative.   Genitourinary: Negative.   Musculoskeletal: Negative for back pain and neck pain.  Skin: Negative.   Allergic/Immunologic: Negative.   Neurological: Negative for dizziness and light-headedness.  Hematological: Negative for adenopathy. Does not bruise/bleed easily.  Psychiatric/Behavioral: Negative for dysphoric mood and sleep disturbance. The patient is not  nervous/anxious.    Vitals:   11/02/17 0850  BP: (!) 112/58  Pulse: 71  Resp: 18  SpO2: 100%  Weight: 238 lb 2 oz (108 kg)  Height: 5\' 9"  (1.753 m)   Wt Readings from Last 3 Encounters:  11/02/17 238 lb 2 oz (108 kg)  08/19/17 239 lb 8 oz (108.6 kg)  07/18/17 241 lb (109.3 kg)   Lab Results  Component Value Date   CREATININE 0.99 10/03/2017   CREATININE 0.99 08/19/2017   CREATININE 0.93 07/18/2017    Physical Exam  Constitutional: She is oriented to person, place, and time. She appears well-developed and well-nourished.   HENT:  Head: Normocephalic and atraumatic.  Neck: Normal range of motion. Neck supple. No JVD present.  Cardiovascular: Normal rate and regular rhythm.  Pulmonary/Chest: Effort normal. She has no wheezes. She has no rales.  Abdominal: Soft. She exhibits no distension. There is no tenderness.  Musculoskeletal: She exhibits no edema or tenderness.  Neurological: She is alert and oriented to person, place, and time.  Skin: Skin is warm and dry.  Psychiatric: She has a normal mood and affect. Her behavior is normal. Thought content normal.  Nursing note and vitals reviewed.  Assessment & Plan:  1: Chronic heart failure with preserved ejection fraction- - NYHA class II - euvolemic today - weighing daily. Reminded to call for an overnight weight gain of >2 pounds or a weekly weight gain of >5 pounds - weight down 16 pounds in the last 6 months - not adding salt to her food and is diligently reading food labels to keep daily sodium consumption to 2000mg  daily - maintain fluid intake to 40-60 ounces of fluid daily - saw cardiology (Dunn) 08/19/17 - saw pulmonologist Nicholos Johns(Ramachandran) 04/06/17 - BNP from 03/27/17 was 83.0  2: HTN- - BP looks good today - continue medications at this time - saw PCP Chalmers Guest([place]) 03/25/17 - BMP from 10/03/17 reviewed and showed sodium 143, potassium 3.8 and GFR >60  Medication list was reviewed.  Will not make a return appointment at this time. Advised patient that she could call back at anytime to make another appointment if needed.

## 2017-11-15 NOTE — Progress Notes (Signed)
Cardiology Office Note Date:  11/17/2017  Patient ID:  Mindy Myers, DOB 07-05-1960, MRN [id number] PCP:  Patient, No Pcp Per  Cardiologist:  Dr. Kirke CorinArida, MD    Chief Complaint: Kirke CorinArida  History of Present Illness: Mindy Myers is a 57 y.o. female with history of history of hypertensive heart disease with chronic diastolic CHF, HLD, and obesity who presents for follow up of hypertension.  She was previously evaluated in 2013 for atypical chest pain, ruled out and underwent nuclear stress testing that was without evidence of ischemia. She reported being diagnosed with HTN years ago and had reportedly been managed with 3 antihypertensives. She had been out of these medications for several months 2/2 finances prior to her admission in early 03/2017. She presented to a new PCP on 03/25/17 and was noted to have BP of 210/124 as well as a UTI. She was started on metoprolol 50 mg bid and Macrobid.   Admitted to [facility] 1/6-1/7 for chest pain and accelerated HTN with an initial BP of 202/86. Cardiac enzymes negative, BNP 83, EKG with sinus rhythm with LVH and repolarization abnormalities. Echo 03/28/17 showed EF of 50-55%, no RWMA, Gr1DD, mildly dilated left atrium, PASP could not be estimated. Lexiscan Myoview 03/28/2017 showed a small defect of mild severity present in the apex location felt to be 2/2 breast attenuation, EF 45-54%, felt to be 2/2 hypertensive heart disease. This was a normal study. She was started on Lipitor, Coreg, irbesartan, and Lasix with a discharge BP of 141/48. In hospital follow up on 04/05/17, she was doing well. Blood pressure log showed systolic readings ranging from 115-152/70s. She was also limiting her sodium to 1,200-2,000 mg daily. BP at that visit noted to be 132/68 with a heart rate of 59 bpm and weight of 262 pounds. She was referred to pulmonology for evaluation of sleep study and seen by them on 04/06/17 with deferrment of sleep study until she acquired health insurance. In  follow up on 04/19/17, she was doing well. BP log from home showed systolic readings ranging from the low 100s to max of 140s with most readings being in the 1 teens to 120s systolic. She continued to limit sodium. BP was 132/80. Weight down another 4 pounds to 258 pounds. She was seen by [facility] on 05/04/17 with BP noted to be 138/56, weight down to 254 pounds. No changes were made. She was seen on 07/18/2017 and was doing well.  Blood pressure continued to range from the low 100s to 130s systolic with an occasional systolic reading in the 80s to 90s. She noted mild dizziness when her BP was on the lower side. She continued to limit her sodium intake. Weight was down another 17 pounds. Her main concern was cold fingers while at work. 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 Due to her occasional soft BP readings and noted bradycardia she was tapered off carvedilol and started on verapamil in the setting of hypertension and possible Raynaud's. In follow up in 07/2017, she was doing well with BP readings typically running in the low 100s to 120s systolic. Her cold digits had improved. In 09/2017 her irbesartan was changed to lisinopril secondary to pharmacy shortage. She was seen in [facility] on 11/02/2017 with a BP of 112/58, weight 238 pounds. No changes were made.   Labs: 07/2017 - LDL 61, unremarkable CMET 4/09817/2019 - normal SCr and stable K+ on  ACEi  She comes in doing well from a cardiac perspective.  Blood pressure remains overall well controlled.  However, she has changed from second shift to first shift at her job and has noted some elevations in her evening blood pressures with this change.  She typically takes lisinopril 20 mg, Lasix 40 mg, and verapamil 180 mg in the morning around 6 AM.  She does not get home until 7:53 PM.  Blood pressure readings in the morning are typically in the 1 teens systolic.  She has had  some readings in the evening hours ranging from 130-150 systolic.  She has not made any dietary changes and continues to monitor her sodium intake strictly.  She was noting some elevations in blood pressure while taking Aleve for back pain though has since stopped this medication.  She is compliant with all medications.  No falls.  She also notes some soreness affecting the dorsal aspect of her right hand.  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  She is also under increased stress at home.  Weight is down 5 pounds from her last office visit.  Past Medical History:  Diagnosis Date  . Chronic diastolic CHF (congestive heart failure) (HCC)    a. TTE 1/19: EF of 50-55%, no RWMA, Gr1DD, mildly dilated left atrium, PASP could not be estimated  . HLD (hyperlipidemia)   . Hypertension   . Hypertensive heart disease    a. TTE 1/19: EF 50-55%, no RWMA, Gr1DD, mildly dilated left atrium, PASP could not be estimated; b. MV 1/19: small defect of mild severity present in the apex location felt to be 2/2 breast attenuation, EF 45-54% felt to be 2/2 hypertensive heart disease. This was a normal study  . Obesity     Past Surgical History:  Procedure Laterality Date  . HAND SURGERY Left     Current Meds  Medication Sig  . aspirin 81 MG EC tablet Take 1 tablet (81 mg total) by mouth daily. Swallow whole.  Marland Kitchen atorvastatin (LIPITOR) 40 MG tablet Take 1 tablet (40 mg total) by mouth daily at 6 PM.  . furosemide (LASIX) 40 MG tablet Take 1 tablet (40 mg total) by mouth daily.  Marland Kitchen lisinopril (PRINIVIL,ZESTRIL) 20 MG tablet Take 1 tablet (20 mg total) by mouth daily.  . verapamil (CALAN-SR) 180 MG CR tablet Take 1 tablet (180 mg total) by mouth daily.    Allergies:   Patient has no known allergies.   Social History:  The patient  reports that she has never smoked. She has never used smokeless tobacco. She reports that she does not drink alcohol or use drugs.     Family History:  The patient's family history includes Cancer in her father; Hypertension in her father and mother.  ROS:   Review of Systems  Constitutional: Negative for chills, diaphoresis, fever, malaise/fatigue and weight loss.  HENT: Negative for congestion.   Eyes: Negative for discharge and redness.  Respiratory: Negative for cough, hemoptysis, sputum production, shortness of breath and wheezing.   Cardiovascular: Negative for chest pain, palpitations, orthopnea, claudication, leg swelling and PND.  Gastrointestinal: Negative for abdominal pain, blood in stool, heartburn, melena, nausea and vomiting.  Genitourinary: Negative for hematuria.  Musculoskeletal: Positive for joint pain. Negative for falls and myalgias.  Skin: Negative for rash.  Neurological: Negative for dizziness, tingling, tremors, sensory change, speech change, focal weakness, loss of consciousness and weakness.  Endo/Heme/Allergies: Does not  bruise/bleed easily.  Psychiatric/Behavioral: Negative for substance abuse. The patient is not nervous/anxious.      PHYSICAL EXAM:  VS:  BP 120/64 (BP Location: Left Arm, Patient Position: Sitting, Cuff Size: Large)   Pulse 80   Ht 5\' 9"  (1.753 m)   Wt 238 lb (108 kg)   BMI 35.15 kg/m  BMI: Body mass index is 35.15 kg/m.  Physical Exam  Constitutional: She is oriented to person, place, and time. She appears well-developed and well-nourished.  HENT:  Head: Normocephalic and atraumatic.  Eyes: Right eye exhibits no discharge. Left eye exhibits no discharge.  Neck: Normal range of motion. No JVD present.  Cardiovascular: Normal rate, regular rhythm, S1 normal, S2 normal and normal heart sounds. Exam reveals no distant heart sounds, no friction rub, no midsystolic click and no opening snap.  No murmur heard. Pulses:      Posterior tibial pulses are 2+ on the right side, and 2+ on the left side.  Pulmonary/Chest: Effort normal and breath sounds normal. No respiratory  distress. She has no decreased breath sounds. She has no wheezes. She has no rales. She exhibits no tenderness.  Abdominal: Soft. She exhibits no distension. There is no tenderness.  Musculoskeletal: She exhibits no edema.  Slight tenderness to palpation along the metacarpals of the right hand.  No erythema, soft tissue swelling, ecchymosis, or wound.  Full range of motion.  5 out of 5 grip strength.  Neurological: She is alert and oriented to person, place, and time.  Skin: Skin is warm and dry. No cyanosis. Nails show no clubbing.  Psychiatric: She has a normal mood and affect. Her speech is normal and behavior is normal. Judgment and thought content normal.     EKG:  Was ordered and interpreted by me today. Shows NSR, 80 bpm, LVH, low voltage along the precordial leads, no acute s/t/ changes   Recent Labs: 03/27/2017: B Natriuretic Peptide 83.0; Hemoglobin 13.9; Platelets 394 03/28/2017: Magnesium 1.8; TSH 2.451 08/19/2017: ALT 16 10/03/2017: BUN 20; Creatinine, Ser 0.99; Potassium 3.8; Sodium 143  03/29/2017: VLDL 26 08/19/2017: Chol/HDL Ratio 2.9; Cholesterol, Total 125; HDL 43; LDL Calculated 61; Triglycerides 103   CrCl cannot be calculated (Patient's most recent lab result is older than the maximum 21 days allowed.).   Wt Readings from Last 3 Encounters:  11/17/17 238 lb (108 kg)  11/02/17 238 lb 2 oz (108 kg)  08/19/17 239 lb 8 oz (108.6 kg)     Other studies reviewed: Additional studies/records reviewed today include: summarized above  ASSESSMENT AND PLAN:  1. Hypertensive heart disease: Blood pressure is well controlled today.  However, since changing jobs shifts from 2nd-1st shift she has noted some elevations in her blood pressure upon returning home from work in the evening hours.  We did discuss several different treatment options today.  We have decided to discontinue her Lasix and place her on HCTZ 12.5 mg once daily.  If needed, we can increase this to 25 mg daily.  Continue  lisinopril 20 mg daily, and verapamil 180 mg daily.  Continue low-sodium diet.  2. Diastolic dysfunction: She does not appear grossly volume overloaded at this time.  We have made a change in her diuretic therapy from Lasix to HCTZ as above.  We will check a BMP in 2 weeks.  Continue low-sodium diet.  Optimal blood pressure control is recommended.  3. Possible Raynaud's syndrome: Much improved since starting verapamil.  Continue current therapy.  4. Hyperlipidemia: LDL 61 based on  lipid panel from 07/2017 with normal liver function.  Continue atorvastatin 40 mg daily.  5. Right hand pain: Likely in the setting of overuse with her job.  Recommend she apply a warm compress as needed.  Follow-up with PCP.  6. Morbid obesity: Continued weight loss is advised through diet and exercise.  Sleep study has been deferred by pulmonology.  Disposition: F/u with me in 4 to 6 weeks.  Current medicines are reviewed at length with the patient today.  The patient did not have any concerns regarding medicines.  Signed, Eula Listen, PA-C 11/17/2017 2:50 PM     [facility] [address] [place], [place] [address] [phone]

## 2017-11-17 ENCOUNTER — Ambulatory Visit (INDEPENDENT_AMBULATORY_CARE_PROVIDER_SITE_OTHER): Payer: PRIVATE HEALTH INSURANCE | Admitting: Physician Assistant

## 2017-11-17 ENCOUNTER — Encounter: Payer: Self-pay | Admitting: Physician Assistant

## 2017-11-17 VITALS — BP 120/64 | HR 80 | Ht 69.0 in | Wt 238.0 lb

## 2017-11-17 DIAGNOSIS — I119 Hypertensive heart disease without heart failure: Secondary | ICD-10-CM

## 2017-11-17 DIAGNOSIS — S66911A Strain of unspecified muscle, fascia and tendon at wrist and hand level, right hand, initial encounter: Secondary | ICD-10-CM | POA: Diagnosis not present

## 2017-11-17 DIAGNOSIS — I7300 Raynaud's syndrome without gangrene: Secondary | ICD-10-CM | POA: Diagnosis not present

## 2017-11-17 DIAGNOSIS — I5189 Other ill-defined heart diseases: Secondary | ICD-10-CM | POA: Diagnosis not present

## 2017-11-17 DIAGNOSIS — E785 Hyperlipidemia, unspecified: Secondary | ICD-10-CM

## 2017-11-17 MED ORDER — HYDROCHLOROTHIAZIDE 12.5 MG PO CAPS: 12.5000 mg | ORAL_CAPSULE | Freq: Every day | ORAL | 3 refills | Status: DC

## 2017-11-17 NOTE — Patient Instructions (Signed)
Medication Instructions: - Your physician has recommended you make the following change in your medication:   1) STOP lasix (furosemide) 2) START hydrochlorothiazide (HCTZ) 12.5 mg- take 1 tablet by mouth once daily  Labwork: - Your physician recommends that you return for lab work in: 2 weeks- BMP  Procedures/Testing: - none ordered  Follow-Up: - Your physician recommends that you schedule a follow-up appointment in: 4-6 weeks with Eula Listenyan Dunn, PA   Any Additional Special Instructions Will Be Listed Below (If Applicable).     If you need a refill on your cardiac medications before your next appointment, please call your pharmacy.

## 2017-11-28 ENCOUNTER — Telehealth: Payer: Self-pay | Admitting: Physician Assistant

## 2017-11-28 NOTE — Telephone Encounter (Signed)
STAT if patient feels like he/she is going to faint   1) Are you dizzy now? A little bit  2) Do you feel faint or have you passed out? As soon as pt moves around she feels like she will, states she had to leave work today.   3) Do you have any other symptoms? No other symptoms  4) Have you checked your HR and BP (record if available)? 119/60 HR 68, 109/71 HR 78, just now 110/64 HR 96

## 2017-11-28 NOTE — Telephone Encounter (Signed)
Spoke with patient and she states that she felt a little better after eating. She reports only having coffee this morning and had developed some lightheadedness at work which required her to go home. She did eat and drink something and felt better and reports that in the past she had some similar issues with this when she got dehydrated. She now feels better and states that she thinks it is because she didn't drink anything. Instructed her to please give [place] a call back if her symptoms return or persist. She verbalized understanding of our conversation, agreement with plan, and had no further questions at this time.

## 2017-12-08 ENCOUNTER — Other Ambulatory Visit
Admission: RE | Admit: 2017-12-08 | Discharge: 2017-12-08 | Disposition: A | Discharge status: Home / Self Care | Payer: PRIVATE HEALTH INSURANCE | Source: Ambulatory Visit | Attending: Physician Assistant | Admitting: Physician Assistant

## 2017-12-08 DIAGNOSIS — S66911A Strain of unspecified muscle, fascia and tendon at wrist and hand level, right hand, initial encounter: Secondary | ICD-10-CM | POA: Insufficient documentation

## 2017-12-08 DIAGNOSIS — I5189 Other ill-defined heart diseases: Secondary | ICD-10-CM | POA: Diagnosis present

## 2017-12-08 LAB — BASIC METABOLIC PANEL
Anion gap: 8 (ref 5–15)
BUN: 15 mg/dL (ref 6–20)
CALCIUM: 8.9 mg/dL (ref 8.9–10.3)
CHLORIDE: 104 mmol/L (ref 98–111)
CO2: 30 mmol/L (ref 22–32)
Creatinine, Ser: 0.78 mg/dL (ref 0.44–1.00)
GFR calc non Af Amer: >60 mL/min (ref >60)
Glucose, Bld: 90 mg/dL (ref 70–99)
Potassium: 3.9 mmol/L (ref 3.5–5.1)
SODIUM: 142 mmol/L (ref 135–145)

## 2017-12-08 NOTE — Addendum Note (Signed)
Addended by: Deloria LairAYLOR, Hersey Maclellan on: 12/08/2017 08:55 AM   Modules accepted: Orders

## 2018-01-11 NOTE — Progress Notes (Signed)
Cardiology Office Note Date:  01/12/2018  Patient ID:  Mindy, Myers 1960-05-18, MRN [id number] PCP:  System, Pcp Not In  Cardiologist:  Dr. Kirke Corin, MD    Chief Complaint: Follow up  History of Present Illness: Mindy Myers is a 57 y.o. female with history of hypertensive heart disease with chronic diastolic CHF, HLD, and obesity who presents for follow up of hypertension.  She was previously evaluated in 2013 for atypical chest pain, ruled out and underwent nuclear stress testing that was without evidence of ischemia. She reported being diagnosed with HTN years ago and had reportedly been managed with 3 antihypertensives. She had been out of these medications for several months 2/2 finances prior to her admission in early 03/2017. She presented to a new PCP on 03/25/17 and was noted to have BP of 210/124 as well as a UTI. She was started on metoprolol 50 mg bid and Macrobid.   Admitted to [facility] 03/2017 for chest pain and accelerated HTN with an initial BP of 202/86. Cardiac enzymes negative, BNP 83, EKG with sinus rhythm with LVH and repolarization abnormalities. Echo 03/28/17 showed EF of 50-55%, no RWMA, Gr1DD, mildly dilated left atrium, PASP could not be estimated. Lexiscan Myoview 03/28/2017 showed a small defect of mild severity present in the apex location felt to be 2/2 breast attenuation, EF 45-54%, felt to be 2/2 hypertensive heart disease. This was a normal study. She was started on Lipitor, Coreg, irbesartan, and Lasix with a discharge BP of 141/48. Since her admission, she has done well with BP running in the 1-teens to 130s systolic. She been keeping a detailed food diary with strict sodium restriction noted. Weight has been down trending (planned through lifestyle changes). She was most recently seen on 8/29 and was doing well, though did note some fluctuations in her BP with shift change at work. Lasix was stopped and she was placed on HCTZ 12.5 mg daily and continued on  lisinopril 20 mg and verapamil 180 mg daily. Weight was stable at 238 pounds. Follow up labs on 9/19 showed a stable renal function on HCTZ.   She comes in doing reasonably well today. She is under increased amounts of stress at home and at work. BP has slowly been trending up with most recent readings being the 120s to 130s with a rare reading into the 150s to 170s. She has not seen readings this high since she restarted her medications as above. She continues to note bilateral hand pain and has not seen anyone regarding this yet. She did have 2 separate days since she was last seen in which she was dizzy. No associated chest pain, palpitations, SOB, presyncope, or syncope. Dizziness spontaneously resolved with food and water. She does not recall if she had eaten prior to going to work those days. None since. Weight today is up 8 pounds from her last visit (238-->246 pounds).   Past Medical History:  Diagnosis Date  . Chronic diastolic CHF (congestive heart failure) (HCC)    a. TTE 1/19: EF of 50-55%, no RWMA, Gr1DD, mildly dilated left atrium, PASP could not be estimated  . HLD (hyperlipidemia)   . Hypertension   . Hypertensive heart disease    a. TTE 1/19: EF 50-55%, no RWMA, Gr1DD, mildly dilated left atrium, PASP could not be estimated; b. MV 1/19: small defect of mild severity present in the apex location felt to be 2/2 breast attenuation, EF 45-54% felt to be 2/2 hypertensive heart disease. This was  a normal study  . Obesity     Past Surgical History:  Procedure Laterality Date  . HAND SURGERY Left     Current Meds  Medication Sig  . aspirin 81 MG EC tablet Take 1 tablet (81 mg total) by mouth daily. Swallow whole.  Marland Kitchen atorvastatin (LIPITOR) 40 MG tablet Take 1 tablet (40 mg total) by mouth daily at 6 PM.  . hydrochlorothiazide (MICROZIDE) 12.5 MG capsule Take 1 capsule (12.5 mg total) by mouth daily.  Marland Kitchen lisinopril (PRINIVIL,ZESTRIL) 20 MG tablet Take 1 tablet (20 mg total) by mouth  daily.  . verapamil (CALAN-SR) 180 MG CR tablet Take 1 tablet (180 mg total) by mouth daily.    Allergies:   Patient has no known allergies.   Social History:  The patient  reports that she has never smoked. She has never used smokeless tobacco. She reports that she does not drink alcohol or use drugs.   Family History:  The patient's family history includes Cancer in her father; Hypertension in her father and mother.  ROS:   Review of Systems  Constitutional: Positive for malaise/fatigue. Negative for chills, diaphoresis, fever and weight loss.  HENT: Negative for congestion.   Eyes: Negative for discharge and redness.  Respiratory: Negative for cough, hemoptysis, sputum production, shortness of breath and wheezing.   Cardiovascular: Negative for chest pain, palpitations, orthopnea, claudication, leg swelling and PND.  Gastrointestinal: Negative for abdominal pain, blood in stool, heartburn, melena, nausea and vomiting.  Genitourinary: Negative for hematuria.  Musculoskeletal: Positive for joint pain. Negative for falls and myalgias.  Skin: Negative for rash.  Neurological: Positive for dizziness. Negative for tingling, tremors, sensory change, speech change, focal weakness, loss of consciousness and weakness.  Endo/Heme/Allergies: Does not bruise/bleed easily.  Psychiatric/Behavioral: Negative for substance abuse. The patient is not nervous/anxious.   All other systems reviewed and are negative.    PHYSICAL EXAM:  VS:  BP 131/83 (BP Location: Left Arm, Patient Position: Sitting, Cuff Size: Large)   Pulse 76   Ht 5\' 9"  (1.753 m)   Wt 246 lb 8 oz (111.8 kg)   BMI 36.40 kg/m  BMI: Body mass index is 36.4 kg/m.  Physical Exam  Constitutional: She is oriented to person, place, and time. She appears well-developed and well-nourished.  HENT:  Head: Normocephalic and atraumatic.  Eyes: Right eye exhibits no discharge. Left eye exhibits no discharge.  Neck: Normal range of motion.  No JVD present.  Cardiovascular: Normal rate, regular rhythm, S1 normal, S2 normal and normal heart sounds. Exam reveals no distant heart sounds, no friction rub, no midsystolic click and no opening snap.  No murmur heard. Pulses:      Posterior tibial pulses are 2+ on the right side, and 2+ on the left side.  Pulmonary/Chest: Effort normal and breath sounds normal. No respiratory distress. She has no decreased breath sounds. She has no wheezes. She has no rales. She exhibits no tenderness.  Abdominal: Soft. She exhibits no distension. There is no tenderness.  Musculoskeletal: She exhibits no edema.  Neurological: She is alert and oriented to person, place, and time.  Skin: Skin is warm and dry. No cyanosis. Nails show no clubbing.  Psychiatric: She has a normal mood and affect. Her speech is normal and behavior is normal. Judgment and thought content normal.     EKG:  Was ordered and interpreted by me today. Shows NSR, 76 bpm, LVH, nonspecific st/t changes leads I, aVL, V5-V6 (previously noted in 03/2017)  Recent  Labs: 03/27/2017: B Natriuretic Peptide 83.0; Hemoglobin 13.9; Platelets 394 03/28/2017: Magnesium 1.8; TSH 2.451 08/19/2017: ALT 16 12/08/2017: BUN 15; Creatinine, Ser 0.78; Potassium 3.9; Sodium 142  03/29/2017: VLDL 26 08/19/2017: Chol/HDL Ratio 2.9; Cholesterol, Total 125; HDL 43; LDL Calculated 61; Triglycerides 103   CrCl cannot be calculated (Patient's most recent lab result is older than the maximum 21 days allowed.).   Wt Readings from Last 3 Encounters:  01/12/18 246 lb 8 oz (111.8 kg)  11/17/17 238 lb (108 kg)  11/02/17 238 lb 2 oz (108 kg)    Orthostatic vital signs: Lying: 109/65, 67 bpm Sitting: 118/74, 76 bpm Standing: 124/74, 89 bpm Standing x 3 min: 122/79, 79 bpm  Other studies reviewed: Additional studies/records reviewed today include: summarized above  ASSESSMENT AND PLAN:  1. Hypertensive heart disease: Blood pressure has bene slowly trending up,  possibly in the setting of increased stress. Increase lisinopril to 40 mg daily. Continue HCTZ 12.5 mg daily as well as verapamil 180 mg daily. Continue BP log.   2. Dizziness: Sounds like this was in the setting of poor PO intake. Resolved. No associated palpitations or chest pain. Call if symptoms return. Check bmet and cbc.    3. Diastolic dysfunction: She does not appear grossly volume up at this time. Feels like her weight gain is not fluid and more so weight. Continue HCTZ, hesitant to escalate her diuretic given occasional BP readings in the 90s systolic. Continue low-sodium diet and optimal BP control.   4. HLD: LDL 61 from 07/2017 with normal liver function. Continue Lipitor 40 mg daily.   5. Morbid obesity: Weight loss advised through healthy lifestyle. Sleep study has been deferred by pulmonology.   Disposition: F/u with me in 3 months.   Current medicines are reviewed at length with the patient today.  The patient did not have any concerns regarding medicines.  Signed, Eula Listen, PA-C 01/12/2018 8:03 AM     [facility] [address] [place], [place] [address] [phone]

## 2018-01-12 ENCOUNTER — Encounter: Payer: Self-pay | Admitting: Physician Assistant

## 2018-01-12 ENCOUNTER — Ambulatory Visit (INDEPENDENT_AMBULATORY_CARE_PROVIDER_SITE_OTHER): Payer: PRIVATE HEALTH INSURANCE | Admitting: Physician Assistant

## 2018-01-12 VITALS — BP 131/83 | HR 76 | Ht 69.0 in | Wt 246.5 lb

## 2018-01-12 DIAGNOSIS — I5189 Other ill-defined heart diseases: Secondary | ICD-10-CM | POA: Diagnosis not present

## 2018-01-12 DIAGNOSIS — R42 Dizziness and giddiness: Secondary | ICD-10-CM

## 2018-01-12 DIAGNOSIS — I119 Hypertensive heart disease without heart failure: Secondary | ICD-10-CM | POA: Diagnosis not present

## 2018-01-12 DIAGNOSIS — E785 Hyperlipidemia, unspecified: Secondary | ICD-10-CM | POA: Diagnosis not present

## 2018-01-12 MED ORDER — LISINOPRIL 40 MG PO TABS: 40.0000 mg | ORAL_TABLET | Freq: Every day | ORAL | 3 refills | Status: DC

## 2018-01-12 NOTE — Patient Instructions (Signed)
Medication Instructions:  INCREASE the Lisinopril to 40 mg daily  If you need a refill on your cardiac medications before your next appointment, please call your pharmacy.   Lab work: Your provider would like for you to have the following labs today: BMET and CBC  If you have labs (blood work) drawn today and your tests are completely normal, you will receive your results only by: Marland Kitchen MyChart Message (if you have MyChart) OR . A paper copy in the mail If you have any lab test that is abnormal or we need to change your treatment, we will call you to review the results.  Testing/Procedures: None ordered  Follow-Up: Your physician recommends that you schedule a follow-up appointment in: 3 months with Eula Listen, PA

## 2018-01-13 LAB — BASIC METABOLIC PANEL
BUN/Creatinine Ratio: 21 (ref 9–23)
BUN: 16 mg/dL (ref 6–24)
CHLORIDE: 100 mmol/L (ref 96–106)
CO2: 26 mmol/L (ref 20–29)
Calcium: 9.3 mg/dL (ref 8.7–10.2)
Creatinine, Ser: 0.78 mg/dL (ref 0.57–1.00)
GFR calc Af Amer: 97 mL/min/{1.73_m2} (ref >59)
GFR calc non Af Amer: 84 mL/min/{1.73_m2} (ref >59)
GLUCOSE: 88 mg/dL (ref 65–99)
Potassium: 4.4 mmol/L (ref 3.5–5.2)
SODIUM: 140 mmol/L (ref 134–144)

## 2018-01-13 LAB — CBC
Hematocrit: 38.7 % (ref 34.0–46.6)
Hemoglobin: 13 g/dL (ref 11.1–15.9)
MCH: 29.1 pg (ref 26.6–33.0)
MCHC: 33.6 g/dL (ref 31.5–35.7)
MCV: 87 fL (ref 79–97)
PLATELETS: 391 10*3/uL (ref 150–450)
RBC: 4.46 x10E6/uL (ref 3.77–5.28)
RDW: 12.3 % (ref 12.3–15.4)
WBC: 5.2 10*3/uL (ref 3.4–10.8)

## 2018-02-18 ENCOUNTER — Encounter: Payer: Self-pay | Admitting: Emergency Medicine

## 2018-02-18 ENCOUNTER — Other Ambulatory Visit: Payer: Self-pay

## 2018-02-18 ENCOUNTER — Emergency Department
Admission: EM | Admit: 2018-02-18 | Discharge: 2018-02-18 | Disposition: A | Discharge status: Home / Self Care | Payer: PRIVATE HEALTH INSURANCE | Attending: Emergency Medicine | Admitting: Emergency Medicine

## 2018-02-18 DIAGNOSIS — I5032 Chronic diastolic (congestive) heart failure: Secondary | ICD-10-CM | POA: Insufficient documentation

## 2018-02-18 DIAGNOSIS — R200 Anesthesia of skin: Secondary | ICD-10-CM | POA: Insufficient documentation

## 2018-02-18 DIAGNOSIS — Z79899 Other long term (current) drug therapy: Secondary | ICD-10-CM | POA: Insufficient documentation

## 2018-02-18 DIAGNOSIS — I110 Hypertensive heart disease with heart failure: Secondary | ICD-10-CM | POA: Insufficient documentation

## 2018-02-18 DIAGNOSIS — Z7982 Long term (current) use of aspirin: Secondary | ICD-10-CM | POA: Insufficient documentation

## 2018-02-18 DIAGNOSIS — R202 Paresthesia of skin: Secondary | ICD-10-CM | POA: Insufficient documentation

## 2018-02-18 DIAGNOSIS — M25531 Pain in right wrist: Secondary | ICD-10-CM | POA: Insufficient documentation

## 2018-02-18 DIAGNOSIS — M779 Enthesopathy, unspecified: Secondary | ICD-10-CM | POA: Insufficient documentation

## 2018-02-18 MED ORDER — PREDNISONE 10 MG PO TABS: ORAL_TABLET | ORAL | 0 refills | Status: DC

## 2018-02-18 NOTE — ED Notes (Signed)
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

## 2018-02-18 NOTE — ED Provider Notes (Signed)
[facility] Emergency Department Provider Note  ____________________________________________  Time seen: Approximately 9:37 AM  I have reviewed the triage vital signs and the nursing notes.   HISTORY  Chief Complaint Wrist Pain    HPI Mindy Myers is a 57 y.o. female presents emergency department for evaluation of wrist pain and numbness and tingling to the third fourth and fifth fingers for weeks worsening yesterday.  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  Her coworkers put [employer] on her wrist yesterday to help alleviate some of the wrist pain.  Wrist is painful to move and touch.  She is right-handed.  No specific trauma.  She has had carpal tunnel in the past and has had surgery.  No neck pain, shoulder pain, elbow pain, shortness of breath, chest pain.  Past Medical History:  Diagnosis Date  . Chronic diastolic CHF (congestive heart failure) (HCC)    a. TTE 1/19: EF of 50-55%, no RWMA, Gr1DD, mildly dilated left atrium, PASP could not be estimated  . HLD (hyperlipidemia)   . Hypertension   . Hypertensive heart disease    a. TTE 1/19: EF 50-55%, no RWMA, Gr1DD, mildly dilated left atrium, PASP could not be estimated; b. MV 1/19: small defect of mild severity present in the apex location felt to be 2/2 breast attenuation, EF 45-54% felt to be 2/2 hypertensive heart disease. This was a normal study  . Obesity     Patient Active Problem List   Diagnosis Date Noted  . HTN (hypertension) 04/05/2017  . CHF (congestive heart failure) (HCC) 03/27/2017    Past Surgical History:  Procedure Laterality Date  . HAND SURGERY Left     Prior to Admission medications   Medication Sig Start Date End Date Taking? Authorizing Provider  aspirin 81 MG EC tablet Take 1 tablet (81 mg total) by mouth daily. Swallow whole. 08/19/17   Dunn, Raymon Mutton, PA-C  atorvastatin (LIPITOR) 40 MG tablet Take 1 tablet (40 mg total) by mouth daily at  6 PM. 04/19/17   Dunn, Raymon Mutton, PA-C  hydrochlorothiazide (MICROZIDE) 12.5 MG capsule Take 1 capsule (12.5 mg total) by mouth daily. 11/17/17 02/15/18  Sondra Barges, PA-C  lisinopril (PRINIVIL,ZESTRIL) 40 MG tablet Take 1 tablet (40 mg total) by mouth daily. 01/12/18 04/12/18  Sondra Barges, PA-C  predniSONE (DELTASONE) 10 MG tablet Take 6 tablets day 1, take 5 tablets day 2, take 4 tablets day 3, take 3 tablets day 4, take 2 tablets day 5, take 1 tablet day 6 02/18/18   Enid Derry, PA-C  verapamil (CALAN-SR) 180 MG CR tablet Take 1 tablet (180 mg total) by mouth daily. 07/18/17 07/18/18  Sondra Barges, PA-C    Allergies Patient has no known allergies.  Family History  Problem Relation Age of Onset  . Hypertension Mother   . Hypertension Father   . Cancer Father        Thyroid    Social History Social History   Tobacco Use  . Smoking status: Never Smoker  . Smokeless tobacco: Never Used  Substance Use Topics  . Alcohol use: No    Comment: Occassional  . Drug use: No     Review of Systems  Cardiovascular: No chest pain. Respiratory: No SOB. Gastrointestinal: No abdominal pain.  No nausea, no vomiting.  Musculoskeletal: Positive for wrist pain. Skin: Negative for rash, abrasions, lacerations, ecchymosis. Neurological: Negative for headaches. Positive for numbness or tingling   ____________________________________________  PHYSICAL EXAM:  VITAL SIGNS: ED Triage Vitals  Enc Vitals Group     BP 02/18/18 0916 (!) 150/72     Pulse Rate 02/18/18 0916 80     Resp 02/18/18 0916 18     Temp 02/18/18 0916 97.9 F (36.6 C)     Temp Source 02/18/18 0916 Oral     SpO2 02/18/18 0916 98 %     Weight 02/18/18 0917 240 lb (108.9 kg)     Height 02/18/18 0917 5\' 9"  (1.753 m)     Head Circumference --      Peak Flow --      Pain Score 02/18/18 0916 8     Pain Loc --      Pain Edu? --      Excl. in GC? --      Constitutional: Alert and oriented. Well appearing and in no acute  distress. Eyes: Conjunctivae are normal. PERRL. EOMI. Head: Atraumatic. ENT:      Ears:      Nose: No congestion/rhinnorhea.      Mouth/Throat: Mucous membranes are moist.  Neck: No stridor. Cardiovascular: Normal rate, regular rhythm.  Good peripheral circulation. Respiratory: Normal respiratory effort without tachypnea or retractions. Lungs CTAB. Good air entry to the bases with no decreased or absent breath sounds. Musculoskeletal: Full range of motion to all extremities. No gross deformities appreciated.  Tenderness to palpation to ulnar right wrist.  Negative Phalen's.  Patient unable to tolerate Tinel's due to wrist pain.  Strength equal in upper extremities bilaterally.  Grip strength intact.  Cap refill less than 3 seconds. Neurologic:  Normal speech and language. No gross focal neurologic deficits are appreciated.  Skin:  Skin is warm, dry and intact. No rash noted. Psychiatric: Mood and affect are normal. Speech and behavior are normal. Patient exhibits appropriate insight and judgement.   ____________________________________________   LABS (all labs ordered are listed, but only abnormal results are displayed)  Labs Reviewed - No data to display ____________________________________________  EKG   ____________________________________________  RADIOLOGY   No results found.  ____________________________________________    PROCEDURES  Procedure(s) performed:    Procedures    Medications - No data to display   ____________________________________________   INITIAL IMPRESSION / ASSESSMENT AND PLAN / ED COURSE  Pertinent labs & imaging results that were available during my care of the patient were reviewed by me and considered in my medical decision making (see chart for details).  Review of the [place] CSRS was performed in accordance of the [facility] prior to dispensing any controlled drugs.   Patient's diagnosis is consistent with tendinitis with nerve  irritation.  Vital signs and exam are reassuring.  Symptoms are likely from repetitive wrist movements at work.  Wrist splint was applied.  Patient will be discharged home with prescriptions for prednisone. Patient is to follow up with primary care as directed. Patient is given ED precautions to return to the ED for any worsening or new symptoms.     ____________________________________________  FINAL CLINICAL IMPRESSION(S) / ED DIAGNOSES  Final diagnoses:  Right wrist pain  Tendinitis      NEW MEDICATIONS STARTED DURING THIS VISIT:  ED Discharge Orders         Ordered    predniSONE (DELTASONE) 10 MG tablet     02/18/18 1037              This chart was dictated using voice recognition software/Dragon. Despite best efforts to proofread, errors can occur which can  change the meaning. Any change was purely unintentional.    Enid Derry, PA-C 02/18/18 1553    Governor Rooks, MD 02/20/18 1011

## 2018-02-18 NOTE — ED Triage Notes (Addendum)
States yesterday at work starting having R wrist pain. 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  Has Coban wrap around wrist. Today noted some swelling and numbness to hand. Hand noted pink war and CRT 2 seconds. Coban removed in triage.

## 2018-03-13 NOTE — Progress Notes (Signed)
Cardiology Office Note Date:  03/21/2018  Patient ID:  Mindy Myers, DOB Jul 30, 1960, MRN [id number] PCP:  System, Pcp Not In  Cardiologist:  Dr. Kirke CorinArida, MD    Chief Complaint: Follow up  History of Present Illness: Mindy Myers is a 57 y.o. female with history of hypertensive heart disease with chronic diastolic CHF, HLD, osteoarthritis affecting the bilateral hands, and obesity who presents for follow up of hypertension.  She was previously evaluated in 2013 for atypical chest pain, ruled out and underwent nuclear stress testing that was without evidence of ischemia. She reported being diagnosed with HTN years ago and had reportedly been managed with 3 antihypertensives. She had been out of these medications for several months 2/2 finances prior to her admission in early 03/2017. She presented to a new PCP on 1/4/19and was noted to have BP of 210/124 as well as a UTI. She was started on metoprolol 50 mg bid and Macrobid.   Admitted to [facility] 03/2017 for chest pain and accelerated HTN with an initial BP of 202/86. Cardiac enzymes negative, BNP 83, EKG with sinus rhythm with LVH and repolarization abnormalities. Echo 03/28/17 showed EF of 50-55%, no RWMA, Gr1DD, mildly dilated left atrium, PASP could not be estimated. Lexiscan Myoview 03/28/2017 showed a small defect of mild severity present in the apex location felt to be 2/2 breast attenuation, EF 45-54%, felt to be 2/2 hypertensive heart disease. This was a normal study. She was started on Lipitor, Coreg, irbesartan, and Lasix with a discharge BP of 141/48. Since her admission, she has done well with BP running in the 1-teens to 130s systolic. She been keeping a detailed food diary with strict sodium restriction noted. Weight has been down trending (planned through lifestyle changes). She was most recently seen on 8/29 and was doing well, though did note some fluctuations in her BP with shift change at work. Lasix was stopped and she was placed  on HCTZ 12.5 mg daily and continued on lisinopril 20 mg and verapamil 180 mg daily. Weight was stable at 238 pounds. Follow up labs on 9/19 showed a stable renal function on HCTZ. She was last seen in the office on 01/12/2018 and was doing reasonably well. She was under increased stress at home and work and had noted an increase in her recent BP readings into the 150s to 170s systolic. Her weight was also up 8 pounds to 246 pounds. Her lisinopril was increased to 40 mg daily and she was continued on HCTZ 12.5 mg daily as well as verapamil 180 mg daily.   She comes in doing well from a cardiac perspective.  She has subsequently been evaluated by a hand specialist and has been diagnosed with severe osteoarthritis affecting the bilateral hands with the right being worse than the left.  For now, they are undergoing a trial of injected steroids to see if this helps alleviate her pain.  If this does not, she may require surgery and she has been told it would be a 5 to 1653-month healing time, especially given the patient's line of work.  With this, the patient has been significantly stressed.  She indicates that she is a stress eater and has been snacking more.  Blood pressures in the setting of her increased stress and steroids have been elevated peaking into the 170s systolic with most readings in the 130s to 150s systolic.  Her weight is up another 6 pounds however she indicates this is in the setting of stress eating.  She has a stable 2 pillow orthopnea.  No lower extremity swelling, PND, or early satiety.  No chest pain or palpitations.  No shortness of breath.  No dizziness, presyncope, or syncope.  Past Medical History:  Diagnosis Date  . Chronic diastolic CHF (congestive heart failure) (HCC)    a. TTE 1/19: EF of 50-55%, no RWMA, Gr1DD, mildly dilated left atrium, PASP could not be estimated  . HLD (hyperlipidemia)   . Hypertension   . Hypertensive heart disease    a. TTE 1/19: EF 50-55%, no RWMA, Gr1DD,  mildly dilated left atrium, PASP could not be estimated; b. MV 1/19: small defect of mild severity present in the apex location felt to be 2/2 breast attenuation, EF 45-54% felt to be 2/2 hypertensive heart disease. This was a normal study  . Obesity     Past Surgical History:  Procedure Laterality Date  . HAND SURGERY Left     Current Meds  Medication Sig  . aspirin 81 MG EC tablet Take 1 tablet (81 mg total) by mouth daily. Swallow whole.  Marland Kitchen. atorvastatin (LIPITOR) 40 MG tablet Take 1 tablet (40 mg total) by mouth daily at 6 PM.  . hydrochlorothiazide (MICROZIDE) 12.5 MG capsule Take 1 capsule (12.5 mg total) by mouth daily.  Marland Kitchen. lisinopril (PRINIVIL,ZESTRIL) 40 MG tablet Take 1 tablet (40 mg total) by mouth daily.  . verapamil (CALAN-SR) 180 MG CR tablet Take 1 tablet (180 mg total) by mouth daily.    Allergies:   Other   Social History:  The patient  reports that she has never smoked. She has never used smokeless tobacco. She reports that she does not drink alcohol or use drugs.   Family History:  The patient's family history includes Cancer in her father; Hypertension in her father and mother.  ROS:   Review of Systems  Constitutional: Negative for chills, diaphoresis, fever, malaise/fatigue and weight loss.  HENT: Negative for congestion.   Eyes: Negative for discharge and redness.  Respiratory: Negative for cough, hemoptysis, sputum production, shortness of breath and wheezing.   Cardiovascular: Negative for chest pain, palpitations, orthopnea, claudication, leg swelling and PND.  Gastrointestinal: Negative for abdominal pain, blood in stool, heartburn, melena, nausea and vomiting.  Genitourinary: Negative for hematuria.  Musculoskeletal: Positive for joint pain. Negative for falls and myalgias.       Bilateral hands  Skin: Negative for rash.  Neurological: Negative for dizziness, tingling, tremors, sensory change, speech change, focal weakness, loss of consciousness and  weakness.  Endo/Heme/Allergies: Does not bruise/bleed easily.  Psychiatric/Behavioral: Negative for substance abuse. The patient is not nervous/anxious.        Increased stress     PHYSICAL EXAM:  VS:  BP 130/70 (BP Location: Left Arm, Patient Position: Sitting, Cuff Size: Normal)   Pulse 78   Ht 5\' 9"  (1.753 m)   Wt 252 lb 8 oz (114.5 kg)   BMI 37.29 kg/m  BMI: Body mass index is 37.29 kg/m.  Physical Exam  Constitutional: She is oriented to person, place, and time. She appears well-developed and well-nourished.  HENT:  Head: Normocephalic and atraumatic.  Eyes: Right eye exhibits no discharge. Left eye exhibits no discharge.  Neck: Normal range of motion. No JVD present.  Cardiovascular: Normal rate, regular rhythm, S1 normal, S2 normal and normal heart sounds. Exam reveals no distant heart sounds, no friction rub, no midsystolic click and no opening snap.  No murmur heard. Pulses:      Posterior tibial pulses are  2+ on the right side and 2+ on the left side.  Pulmonary/Chest: Effort normal and breath sounds normal. No respiratory distress. She has no decreased breath sounds. She has no wheezes. She has no rales. She exhibits no tenderness.  Abdominal: Soft. She exhibits no distension. There is no abdominal tenderness.  Musculoskeletal:        General: No edema.     Comments: Braces noted along the bilateral hands  Neurological: She is alert and oriented to person, place, and time.  Skin: Skin is warm and dry. No cyanosis. Nails show no clubbing.  Psychiatric: She has a normal mood and affect. Her speech is normal and behavior is normal. Judgment and thought content normal.     EKG:  Was ordered and interpreted by me today. Shows NSR, 78 bpm, LVH, nonspecific ST-T changes (unchanged from prior)  Recent Labs: 03/27/2017: B Natriuretic Peptide 83.0 03/28/2017: Magnesium 1.8; TSH 2.451 08/19/2017: ALT 16 01/12/2018: BUN 16; Creatinine, Ser 0.78; Hemoglobin 13.0; Platelets 391;  Potassium 4.4; Sodium 140  03/29/2017: VLDL 26 08/19/2017: Chol/HDL Ratio 2.9; Cholesterol, Total 125; HDL 43; LDL Calculated 61; Triglycerides 103   CrCl cannot be calculated (Patient's most recent lab result is older than the maximum 21 days allowed.).   Wt Readings from Last 3 Encounters:  03/21/18 252 lb 8 oz (114.5 kg)  02/18/18 240 lb (108.9 kg)  01/12/18 246 lb 8 oz (111.8 kg)     Other studies reviewed: Additional studies/records reviewed today include: summarized above  ASSESSMENT AND PLAN:  1. Hypertensive heart disease: Blood pressure has been suboptimally controlled lately in the setting of increased bilateral hand pain, stress, and steroids.  In this setting, we will increase her verapamil to 240 mg daily.  She will otherwise remain on HCTZ 12.5 mg daily and lisinopril 40 mg daily.  Continue low-sodium and heart healthy diet.  2. Diastolic dysfunction: She does not appear grossly volume up at this time.  Her weight gain is in the setting of increased p.o. intake secondary to stress eating.  Remains on HCTZ.  Continue low-sodium diet and optimal blood pressure control.  3. Hyperlipidemia: LDL of 61 from 07/2017 with normal liver function.  Remains on Lipitor 40 mg daily.  4. Morbid obesity: Weight loss is advised.  Healthy lifestyle.  Sleep study has been deferred by pulmonology in the setting of the patient's lack of insurance.  Try and curtail some of the stress eating.  Disposition: F/u with Dr. Kirke Corin or an APP in 6 months, sooner if needed.  Current medicines are reviewed at length with the patient today.  The patient did not have any concerns regarding medicines.  Signed, Eula Listen, PA-C 03/21/2018 7:57 AM     [facility] [place] [address] [place], [place] [address] [phone]

## 2018-03-21 ENCOUNTER — Ambulatory Visit (INDEPENDENT_AMBULATORY_CARE_PROVIDER_SITE_OTHER): Payer: PRIVATE HEALTH INSURANCE | Admitting: Physician Assistant

## 2018-03-21 ENCOUNTER — Encounter: Payer: Self-pay | Admitting: Physician Assistant

## 2018-03-21 VITALS — BP 130/70 | HR 78 | Ht 69.0 in | Wt 252.5 lb

## 2018-03-21 DIAGNOSIS — M79641 Pain in right hand: Secondary | ICD-10-CM

## 2018-03-21 DIAGNOSIS — I5189 Other ill-defined heart diseases: Secondary | ICD-10-CM | POA: Diagnosis not present

## 2018-03-21 DIAGNOSIS — I119 Hypertensive heart disease without heart failure: Secondary | ICD-10-CM

## 2018-03-21 DIAGNOSIS — M79642 Pain in left hand: Secondary | ICD-10-CM

## 2018-03-21 DIAGNOSIS — E785 Hyperlipidemia, unspecified: Secondary | ICD-10-CM

## 2018-03-21 MED ORDER — VERAPAMIL HCL ER 240 MG PO TBCR: 240.0000 mg | EXTENDED_RELEASE_TABLET | Freq: Every day | ORAL | 1 refills | Status: DC

## 2018-03-21 MED ORDER — HYDROCHLOROTHIAZIDE 12.5 MG PO CAPS: 12.5000 mg | ORAL_CAPSULE | Freq: Every day | ORAL | 1 refills | Status: DC

## 2018-03-21 MED ORDER — LISINOPRIL 40 MG PO TABS: 40.0000 mg | ORAL_TABLET | Freq: Every day | ORAL | 1 refills | Status: DC

## 2018-03-21 MED ORDER — ATORVASTATIN CALCIUM 40 MG PO TABS: 40.0000 mg | ORAL_TABLET | Freq: Every day | ORAL | 1 refills | Status: DC

## 2018-03-21 NOTE — Patient Instructions (Signed)
Medication Instructions:  INCREASE the Verapamil to 240 mg tablet daily  If you need a refill on your cardiac medications before your next appointment, please call your pharmacy.   Lab work: None ordered  Testing/Procedures: None ordered  Follow-Up: At [facility], you and your health needs are our priority.  As part of our continuing mission to provide you with exceptional heart care, we have created designated Provider Care Teams.  These Care Teams include your primary Cardiologist (physician) and Advanced Practice Providers (APPs -  Physician Assistants and Nurse Practitioners) who all work together to provide you with the care you need, when you need it. You will need a follow up appointment in 6 months with Mindy Listenyan Dunn, PA  Please call our office 2 months in advance to schedule this appointment.

## 2018-04-22 ENCOUNTER — Other Ambulatory Visit: Payer: Self-pay

## 2018-04-22 ENCOUNTER — Encounter: Payer: Self-pay | Admitting: Emergency Medicine

## 2018-04-22 ENCOUNTER — Emergency Department
Admission: EM | Admit: 2018-04-22 | Discharge: 2018-04-22 | Disposition: A | Discharge status: Home / Self Care | Payer: PRIVATE HEALTH INSURANCE | Attending: Emergency Medicine | Admitting: Emergency Medicine

## 2018-04-22 DIAGNOSIS — R1030 Lower abdominal pain, unspecified: Secondary | ICD-10-CM | POA: Diagnosis present

## 2018-04-22 DIAGNOSIS — Z7982 Long term (current) use of aspirin: Secondary | ICD-10-CM | POA: Diagnosis not present

## 2018-04-22 DIAGNOSIS — N3000 Acute cystitis without hematuria: Secondary | ICD-10-CM | POA: Diagnosis not present

## 2018-04-22 DIAGNOSIS — I5032 Chronic diastolic (congestive) heart failure: Secondary | ICD-10-CM | POA: Insufficient documentation

## 2018-04-22 DIAGNOSIS — Z79899 Other long term (current) drug therapy: Secondary | ICD-10-CM | POA: Insufficient documentation

## 2018-04-22 DIAGNOSIS — I110 Hypertensive heart disease with heart failure: Secondary | ICD-10-CM | POA: Insufficient documentation

## 2018-04-22 MED ORDER — PHENAZOPYRIDINE HCL 95 MG PO TABS: 95.0000 mg | ORAL_TABLET | Freq: Three times a day (TID) | ORAL | 0 refills | Status: DC | PRN

## 2018-04-22 MED ORDER — CEPHALEXIN 500 MG PO CAPS
500.0000 mg | ORAL_CAPSULE | Freq: Once | ORAL | Status: AC
  Administered 2018-04-22: 500 mg via ORAL
  Filled 2018-04-22: qty 1

## 2018-04-22 MED ORDER — CEPHALEXIN 500 MG PO CAPS: 500.0000 mg | ORAL_CAPSULE | Freq: Four times a day (QID) | ORAL | 0 refills | Status: DC

## 2018-04-22 NOTE — ED Triage Notes (Signed)
Low back pain x 2 days. Denies fall or injury.

## 2018-04-22 NOTE — ED Notes (Signed)
Pt to ED with c/o of lower back pain that started 2 days ago. Pt states pain radiates to pelvis and had similar pain in the past with UTI. Pt states frequent urination but denies burning or difficulty.

## 2018-04-22 NOTE — ED Notes (Signed)
Pt verbalized understanding of discharge instructions. NAD at this time. 

## 2018-04-22 NOTE — ED Provider Notes (Signed)
[facility] Emergency Department Provider Note  ____________________________________________  Time seen: Approximately 6:49 PM  I have reviewed the triage vital signs and the nursing notes.   HISTORY  Chief Complaint Back Pain    HPI Mindy Myers is a 58 y.o. female who presents emergency department concern for UTI.  Patient states that she has had some suprapubic pressure, low back pain consistent with previous UTIs.  Patient has no recent history of UTI.  She states that she has mild dysuria but no hematuria.  No fevers or chills.  No recent antibiotic use.  Patient denies any GI symptoms.  She states that this does not feel like "muscles."  No history of nephrolithiasis.  Patient has a history of hypertension, CHF.  No complaints of chronic medical problems.  Past Medical History:  Diagnosis Date  . Chronic diastolic CHF (congestive heart failure) (HCC)    a. TTE 1/19: EF of 50-55%, no RWMA, Gr1DD, mildly dilated left atrium, PASP could not be estimated  . HLD (hyperlipidemia)   . Hypertension   . Hypertensive heart disease    a. TTE 1/19: EF 50-55%, no RWMA, Gr1DD, mildly dilated left atrium, PASP could not be estimated; b. MV 1/19: small defect of mild severity present in the apex location felt to be 2/2 breast attenuation, EF 45-54% felt to be 2/2 hypertensive heart disease. This was a normal study  . Obesity     Patient Active Problem List   Diagnosis Date Noted  . HTN (hypertension) 04/05/2017  . CHF (congestive heart failure) (HCC) 03/27/2017    Past Surgical History:  Procedure Laterality Date  . HAND SURGERY Left     Prior to Admission medications   Medication Sig Start Date End Date Taking? Authorizing Provider  aspirin 81 MG EC tablet Take 1 tablet (81 mg total) by mouth daily. Swallow whole. 08/19/17   Dunn, Raymon Muttonyan M, PA-C  atorvastatin (LIPITOR) 40 MG tablet Take 1 tablet (40 mg total) by mouth daily at 6 PM. 03/21/18   Dunn, Raymon Muttonyan M,  PA-C  cephALEXin (KEFLEX) 500 MG capsule Take 1 capsule (500 mg total) by mouth 4 (four) times daily. 04/22/18   Cuthriell, Delorise RoyalsJonathan D, PA-C  hydrochlorothiazide (MICROZIDE) 12.5 MG capsule Take 1 capsule (12.5 mg total) by mouth daily. 03/21/18 06/19/18  Sondra Bargesunn, Ryan M, PA-C  lisinopril (PRINIVIL,ZESTRIL) 40 MG tablet Take 1 tablet (40 mg total) by mouth daily. 03/21/18 06/19/18  Sondra Bargesunn, Ryan M, PA-C  phenazopyridine (PYRIDIUM) 95 MG tablet Take 1 tablet (95 mg total) by mouth 3 (three) times daily as needed for pain. 04/22/18   Cuthriell, Delorise RoyalsJonathan D, PA-C  verapamil (CALAN-SR) 240 MG CR tablet Take 1 tablet (240 mg total) by mouth daily. 03/21/18 03/21/19  Sondra Bargesunn, Ryan M, PA-C    Allergies Other  Family History  Problem Relation Age of Onset  . Hypertension Mother   . Hypertension Father   . Cancer Father        Thyroid    Social History Social History   Tobacco Use  . Smoking status: Never Smoker  . Smokeless tobacco: Never Used  Substance Use Topics  . Alcohol use: No    Comment: Occassional  . Drug use: No     Review of Systems  Constitutional: No fever/chills Eyes: No visual changes. No discharge ENT: No upper respiratory complaints. Cardiovascular: no chest pain. Respiratory: no cough. No SOB. Gastrointestinal: No abdominal pain.  No nausea, no vomiting.  No diarrhea.  No constipation. Genitourinary:  Mild dysuria.  Mild suprapubic cramping, low back pain.. No hematuria Musculoskeletal: Negative for musculoskeletal pain. Skin: Negative for rash, abrasions, lacerations, ecchymosis. Neurological: Negative for headaches, focal weakness or numbness. 10-point ROS otherwise negative.  ____________________________________________   PHYSICAL EXAM:  VITAL SIGNS: ED Triage Vitals [04/22/18 1543]  Enc Vitals Group     BP (!) 162/70     Pulse Rate 73     Resp 18     Temp 97.7 F (36.5 C)     Temp Source Oral     SpO2 97 %     Weight 249 lb (112.9 kg)     Height 5\' 9"  (1.753  m)     Head Circumference      Peak Flow      Pain Score 8     Pain Loc      Pain Edu?      Excl. in GC?      Constitutional: Alert and oriented. Well appearing and in no acute distress. Eyes: Conjunctivae are normal. PERRL. EOMI. Head: Atraumatic. Neck: No stridor.   Hematological/Lymphatic/Immunilogical: No cervical lymphadenopathy. Cardiovascular: Normal rate, regular rhythm. Normal S1 and S2.  Good peripheral circulation. Respiratory: Normal respiratory effort without tachypnea or retractions. Lungs CTAB. Good air entry to the bases with no decreased or absent breath sounds. Gastrointestinal: Bowel sounds 4 quadrants.  Mild tenderness on palpation of the suprapubic region.. No guarding or rigidity. No palpable masses. No distention. No CVA tenderness. Musculoskeletal: Full range of motion to all extremities. No gross deformities appreciated. Neurologic:  Normal speech and language. No gross focal neurologic deficits are appreciated.  Skin:  Skin is warm, dry and intact. No rash noted. Psychiatric: Mood and affect are normal. Speech and behavior are normal. Patient exhibits appropriate insight and judgement.   ____________________________________________   LABS (all labs ordered are listed, but only abnormal results are displayed)  Labs Reviewed - No data to display ____________________________________________  EKG   ____________________________________________  RADIOLOGY   No results found.  ____________________________________________    PROCEDURES  Procedure(s) performed:    Procedures    Medications  cephALEXin (KEFLEX) capsule 500 mg (has no administration in time range)     ____________________________________________   INITIAL IMPRESSION / ASSESSMENT AND PLAN / ED COURSE  Pertinent labs & imaging results that were available during my care of the patient were reviewed by me and considered in my medical decision making (see chart for  details).  Review of the [place] CSRS was performed in accordance of the [facility] prior to dispensing any controlled drugs.      Patient's diagnosis is consistent with UTI.  Patient presents emergency department with urinary tract infection symptoms.  Patient was placed in a room without a urinalysis.  Given the wait time when I enter the room, patient declines to wait for a urinalysis but states that her symptoms are consistent with UTI.  I concur.  No indication of pyelonephritis.  Differential included pyelonephritis, nephrolithiasis, low back pain, sciatica.  At this time, patient will be treated empirically based on symptoms.  Patient will be prescribed Keflex, Pyridium.  If symptoms change or worsen she may follow-up in the emergency department or with primary care..  Patient is given ED precautions to return to the ED for any worsening or new symptoms.     ____________________________________________  FINAL CLINICAL IMPRESSION(S) / ED DIAGNOSES  Final diagnoses:  Acute cystitis without hematuria      NEW MEDICATIONS STARTED DURING THIS VISIT:  ED Discharge  Orders         Ordered    cephALEXin (KEFLEX) 500 MG capsule  4 times daily     04/22/18 1906    phenazopyridine (PYRIDIUM) 95 MG tablet  3 times daily PRN     04/22/18 1906              This chart was dictated using voice recognition software/Dragon. Despite best efforts to proofread, errors can occur which can change the meaning. Any change was purely unintentional.    Racheal Patches, PA-C 04/22/18 1906    Emily Filbert, MD 04/22/18 2228

## 2018-05-01 ENCOUNTER — Other Ambulatory Visit: Payer: Self-pay

## 2018-05-01 MED ORDER — LISINOPRIL 40 MG PO TABS: 40.0000 mg | ORAL_TABLET | Freq: Every day | ORAL | 1 refills | Status: DC

## 2018-05-01 MED ORDER — ASPIRIN 81 MG PO TBEC: 81.0000 mg | DELAYED_RELEASE_TABLET | Freq: Every day | ORAL | 3 refills | Status: DC

## 2018-05-01 MED ORDER — VERAPAMIL HCL ER 240 MG PO TBCR: 240.0000 mg | EXTENDED_RELEASE_TABLET | Freq: Every day | ORAL | 1 refills | Status: DC

## 2018-05-01 MED ORDER — ATORVASTATIN CALCIUM 40 MG PO TABS: 40.0000 mg | ORAL_TABLET | Freq: Every day | ORAL | 1 refills | Status: DC

## 2018-05-01 MED ORDER — HYDROCHLOROTHIAZIDE 12.5 MG PO CAPS: 12.5000 mg | ORAL_CAPSULE | Freq: Every day | ORAL | 1 refills | Status: DC

## 2018-05-01 NOTE — Telephone Encounter (Signed)
*  STAT* If patient is at the pharmacy, call can be transferred to refill team.   1. Which medications need to be refilled? (please list name of each medication and dose if known)  Aspirin, Lipitor, hydrochlorothiazide, Lisinopril, Verapamil  2. Which pharmacy/location (including street and city if local pharmacy) is medication to be sent to? [facility]  3. Do they need a 30 day or 90 day supply? 30

## 2018-06-06 ENCOUNTER — Telehealth: Payer: Self-pay | Admitting: Physician Assistant

## 2018-06-06 NOTE — Telephone Encounter (Signed)
Patient calling Wants to know if there is any advise on COVID-19 for someone who has a heart condition and who works around a lot of people  Please call to discuss

## 2018-06-06 NOTE — Telephone Encounter (Signed)
Spoke with patient and reviewed recommendations for social distancing and limit contact with large crowds. She verbalized understanding with no further questions at this time.

## 2018-08-21 DIAGNOSIS — U071 COVID-19: Secondary | ICD-10-CM | POA: Insufficient documentation

## 2018-08-21 HISTORY — DX: COVID-19: U07.1

## 2018-09-19 ENCOUNTER — Other Ambulatory Visit: Payer: Self-pay

## 2018-09-19 ENCOUNTER — Telehealth: Payer: Self-pay | Admitting: Physician Assistant

## 2018-09-19 ENCOUNTER — Emergency Department
Admission: EM | Admit: 2018-09-19 | Discharge: 2018-09-19 | Disposition: A | Discharge status: Home / Self Care | Payer: PRIVATE HEALTH INSURANCE | Attending: Student in an Organized Health Care Education/Training Program | Admitting: Student in an Organized Health Care Education/Training Program

## 2018-09-19 DIAGNOSIS — I5032 Chronic diastolic (congestive) heart failure: Secondary | ICD-10-CM | POA: Diagnosis not present

## 2018-09-19 DIAGNOSIS — U071 COVID-19: Secondary | ICD-10-CM | POA: Diagnosis not present

## 2018-09-19 DIAGNOSIS — I110 Hypertensive heart disease with heart failure: Secondary | ICD-10-CM | POA: Insufficient documentation

## 2018-09-19 DIAGNOSIS — Z79899 Other long term (current) drug therapy: Secondary | ICD-10-CM | POA: Insufficient documentation

## 2018-09-19 DIAGNOSIS — Z7982 Long term (current) use of aspirin: Secondary | ICD-10-CM | POA: Diagnosis not present

## 2018-09-19 DIAGNOSIS — R05 Cough: Secondary | ICD-10-CM | POA: Insufficient documentation

## 2018-09-19 DIAGNOSIS — R21 Rash and other nonspecific skin eruption: Secondary | ICD-10-CM | POA: Diagnosis present

## 2018-09-19 MED ORDER — METHYLPREDNISOLONE 4 MG PO TBPK: ORAL_TABLET | ORAL | 0 refills | Status: DC

## 2018-09-19 MED ORDER — HYDROXYZINE HCL 25 MG PO TABS: 25.0000 mg | ORAL_TABLET | Freq: Three times a day (TID) | ORAL | 0 refills | Status: DC | PRN

## 2018-09-19 NOTE — ED Triage Notes (Addendum)
Pt comes via [facility] from home with c/o rash and cough. Pt states she was prescribed medication and began taking it. Pt states since she has developed a rash and itching around back side of her neck. Pt also states some swelling to her eyes but it has since subsided since EMS gave her Benadryl.\  Pt also reports cough that has been dry non productive.   Pt states she may have some mucus stuck in her throat. Pt states she does feel better since getting the medication.  Pt denies any SOB, chest pain, dizziness or headache.

## 2018-09-19 NOTE — ED Provider Notes (Signed)
[facility] Emergency Department Provider Note   ____________________________________________   First MD Initiated Contact with Patient 09/19/18 2011     (approximate)  I have reviewed the triage vital signs and the nursing notes.   HISTORY  Chief Complaint Rash    HPI Mindy Myers is a 58 y.o. female patient arrived via EMS complaining of a rash to the left lateral neck.  Patient states the rash is itching.  Patient today with some mild swelling her eyes but resolved status post EMS giving her 25 mg of Benadryl.  Patient also reports a nonproductive cough.  Patient states she feels she has mucus stuck in her throat but again it is better since she took the Benadryl.  Patient denies shortness of breath, chest pain, vertigo, headache, or fatigue.  Patient recently diagnosed with COVID-19.  Prior to the diagnosis of COVID 19 patient was diagnosed with sinusitis and placed on amoxicillin and Tessalon Perles.      Past Medical History:  Diagnosis Date  . Chronic diastolic CHF (congestive heart failure) ([place])    a. TTE 1/19: EF of 50-55%, no RWMA, Gr1DD, mildly dilated left atrium, PASP could not be estimated  . HLD (hyperlipidemia)   . Hypertension   . Hypertensive heart disease    a. TTE 1/19: EF 50-55%, no RWMA, Gr1DD, mildly dilated left atrium, PASP could not be estimated; b. MV 1/19: small defect of mild severity present in the apex location felt to be 2/2 breast attenuation, EF 45-54% felt to be 2/2 hypertensive heart disease. This was a normal study  . Obesity     Patient Active Problem List   Diagnosis Date Noted  . HTN (hypertension) 04/05/2017  . CHF (congestive heart failure) ([place]) 03/27/2017    Past Surgical History:  Procedure Laterality Date  . HAND SURGERY Left     Prior to Admission medications   Medication Sig Start Date End Date Taking? Authorizing Provider  aspirin 81 MG EC tablet Take 1 tablet (81 mg total) by mouth daily.  Swallow whole. 05/01/18   Dunn, Areta Haber, PA-C  atorvastatin (LIPITOR) 40 MG tablet Take 1 tablet (40 mg total) by mouth daily at 6 PM. 05/01/18   Dunn, Areta Haber, PA-C  cephALEXin (KEFLEX) 500 MG capsule Take 1 capsule (500 mg total) by mouth 4 (four) times daily. 04/22/18   Cuthriell, Charline Bills, PA-C  hydrochlorothiazide (MICROZIDE) 12.5 MG capsule Take 1 capsule (12.5 mg total) by mouth daily. 05/01/18 07/30/18  Rise Mu, PA-C  hydrOXYzine (ATARAX/VISTARIL) 25 MG tablet Take 1 tablet (25 mg total) by mouth 3 (three) times daily as needed. 09/19/18   Sable Feil, PA-C  lisinopril (PRINIVIL,ZESTRIL) 40 MG tablet Take 1 tablet (40 mg total) by mouth daily. 05/01/18 07/30/18  Rise Mu, PA-C  methylPREDNISolone (MEDROL DOSEPAK) 4 MG TBPK tablet Take Tapered dose as directed 09/19/18   Sable Feil, PA-C  phenazopyridine (PYRIDIUM) 95 MG tablet Take 1 tablet (95 mg total) by mouth 3 (three) times daily as needed for pain. 04/22/18   Cuthriell, Charline Bills, PA-C  verapamil (CALAN-SR) 240 MG CR tablet Take 1 tablet (240 mg total) by mouth daily. 05/01/18 05/01/19  Rise Mu, PA-C    Allergies Other  Family History  Problem Relation Age of Onset  . Hypertension Mother   . Hypertension Father   . Cancer Father        Thyroid    Social History Social History   Tobacco  Use  . Smoking status: Never Smoker  . Smokeless tobacco: Never Used  Substance Use Topics  . Alcohol use: No    Comment: Occassional  . Drug use: No    Review of Systems Constitutional: No fever/chills Eyes: No visual changes. ENT: No sore throat.  Nasal congestion. Cardiovascular: Denies chest pain. Respiratory: Denies shortness of breath. Gastrointestinal: No abdominal pain.  No nausea, no vomiting.  No diarrhea.  No constipation. Genitourinary: Negative for dysuria. Musculoskeletal: Negative for back pain. Skin: Positive for rash. Neurological: Negative for headaches, focal weakness or numbness.    ____________________________________________   PHYSICAL EXAM:  VITAL SIGNS: ED Triage Vitals [09/19/18 1840]  Enc Vitals Group     BP 124/72     Pulse Rate 79     Resp 18     Temp 98.3 F (36.8 C)     Temp src      SpO2 99 %     Weight      Height      Head Circumference      Peak Flow      Pain Score 0     Pain Loc      Pain Edu?      Excl. in GC?    Constitutional: Alert and oriented. Well appearing and in no acute distress. Nose: No congestion/rhinnorhea. Mouth/Throat: Mucous membranes are moist.  Oropharynx non-erythematous. Neck: No stridor.   Hematological/Lymphatic/Immunilogical: No cervical lymphadenopathy. Cardiovascular: Normal rate, regular rhythm. Grossly normal heart sounds.  Good peripheral circulation. Respiratory: Normal respiratory effort.  No retractions. Lungs CTAB. Neurologic:  Normal speech and language. No gross focal neurologic deficits are appreciated. No gait instability. Skin:  Skin is warm, dry and intact.  Resolving macular lesion left lateral neck. Psychiatric: Mood and affect are normal. Speech and behavior are normal.  ____________________________________________   LABS (all labs ordered are listed, but only abnormal results are displayed)  Labs Reviewed - No data to display ____________________________________________  EKG   ____________________________________________  RADIOLOGY  ED MD interpretation:    Official radiology report(s): No results found.  ____________________________________________   PROCEDURES  Procedure(s) performed (including Critical Care):  Procedures   ____________________________________________   INITIAL IMPRESSION / ASSESSMENT AND PLAN / ED COURSE  As part of my medical decision making, I reviewed the following data within the electronic MEDICAL RECORD NUMBER         Patient presents to ED concern for rash to start on left lateral aspect of neck today.  Patient state rash and itching is  resolving status post given Benadryl while in route to ED by EMS.  Patient was recently diagnosed with COVID-19 and sinusitis.  Advised patient to continue previous medication and will start Atarax and Medrol Dosepak.  Advised patient to continue isolation and follow-up PCP telephonically.      ____________________________________________   FINAL CLINICAL IMPRESSION(S) / ED DIAGNOSES  Final diagnoses:  Rash and nonspecific skin eruption     ED Discharge Orders         Ordered    hydrOXYzine (ATARAX/VISTARIL) 25 MG tablet  3 times daily PRN     09/19/18 2027    methylPREDNISolone (MEDROL DOSEPAK) 4 MG TBPK tablet     09/19/18 2032           Note:  This document was prepared using Dragon voice recognition software and may include unintentional dictation errors.    Joni ReiningSmith, Faithlyn Recktenwald K, PA-C 09/19/18 2035    Willy Eddyobinson, Patrick, MD 09/19/18 [phone]

## 2018-09-19 NOTE — Discharge Instructions (Addendum)
Continue previous medications.  Start Atarax as directed.

## 2018-09-19 NOTE — ED Notes (Signed)
Esign not working at this time. Pt verbalized discharge instructions and has no questions 

## 2018-09-19 NOTE — ED Triage Notes (Signed)
EMS report: Pt arrives COVID+ and was dx on Saturday. Pt was prescribed amoxicillin and has since developed a rash last night. EMS gave 25 of Benadryl oral. VSS

## 2018-09-19 NOTE — ED Notes (Signed)
Pt was urgent care Friday and dx with sinus infection and Covid 19. Started ampicillin yesterday and now has rash to left neck area that is itching. Pt unsure of allergic reaction. States earlier felt like throat was thick, states ems gave her benadryl and those symptoms are relieved.

## 2018-09-19 NOTE — Telephone Encounter (Signed)
Pt reports she was recently dx with CV 19 virus. Was instructed by PCP to inc fluid intake.   Pt questioned how much she should take.   Per verbal from [employer], PA, inc fluid by 3 (8 oz glasses) per day if having fevers. If no fever, can maintain normal fluid intake.   Advised pt to call for any further questions or concerns.

## 2018-09-19 NOTE — Telephone Encounter (Signed)
Patient was told to push fluids and given medications for covid positive symptoms and she wants to make sure this is ok with cards.    She was advised previously by cards to watch PO intake

## 2018-09-26 ENCOUNTER — Ambulatory Visit: Payer: PRIVATE HEALTH INSURANCE | Admitting: Physician Assistant

## 2018-10-15 NOTE — Progress Notes (Signed)
Cardiology Office Note Date:  10/17/2018  Patient ID:  Mindy Myers, DOB 08-04-1960, MRN [id number] [facility]  Cardiologist:  Dr. Kirke CorinArida, MD    Chief Complaint: Follow up  History of Present Illness: Mindy Myers is a 58 y.o. female with history of hypertensive heart disease with chronic diastolic CHF, HLD, osteoarthritis affecting the bilateral hands, and obesity who presents for follow up of hypertension.  She was previously evaluated in 2013 for atypical chest pain, ruled out and underwent nuclear stress testing that was without evidence of ischemia. She reported being diagnosed with HTN years ago and had reportedly been managed with 3 antihypertensives. She had been out of these medications for several months 2/2 finances prior to her admission in early 03/2017. [facility] 1/203919for chest pain and accelerated HTN with an initial BP of 202/86. Cardiac enzymes negative, BNP 83, EKG with sinus rhythm with LVH and repolarization abnormalities. Echo 03/28/17 showed EF of 50-55%, no RWMA, Gr1DD, mildly dilated left atrium, PASP could not be estimated. Lexiscan Myoview 03/28/2017 showed a small defect of mild severity present in the apex location felt to be 2/2 breast attenuation, EF 45-54%, felt to be 2/2 hypertensive heart disease. This was a normal study. She was started on Lipitor, Coreg, irbesartan, and Lasix with a discharge BP of 141/48. Since her admission, she has done well. She has been keeping a detailed food diary with strict sodium restriction noted. Weight has been down trending (planned through lifestyle changes).  In late 2019 she noted changes in her work schedule and increase stress at home leading to some fluctuations in blood pressure with subsequent improvement following titration of antihypertensive therapy.  I last saw her in clinic in 02/2018 for routine follow-up.  She was doing well from a cardiac perspective.  She had been seen by hand  specialist with subsequent diagnosis of severe osteoarthritis affecting the bilateral hands.  They were trying conservative therapy with steroid injections to help alleviate her pain.  In this setting, she was stress eating and noted slight increase in weight as well as increase in BP.   Her verapamil was increased to 240 mg daily otherwise, she was continued on HCTZ 12.5 mg daily and lisinopril 40 mg daily.  Patient had to postpone her follow-up with me as she tested positive for COVID-19 on 09/15/2018.  Patient comes in doing reasonably well.  She notes for the first couple of weeks after being diagnosed with COVID she had minimal appetite and was eating Jell-O and pretzels almost exclusively secondary to lack of taste.  She indicates a T-max of 101 during her illness.  She continues to note significant fatigue following this recent illness and is more short of breath with ambulation.  She is somewhat surprised with her weight gain given her poor appetite for the past month.  Her weight is up 25 pounds when compared to her last visit with us in 02/2018.  She denies any orthopnea, abdominal distention, or PND.  She does note some mild lower extremity swelling.  At rest she is without shortness of breath.  She denies any chest pain, falls, BRBPR, melena.  BP at home this morning was noted to be 130 systolic.  Review of BP log shows most BP readings in the 120s to 150s systolic.  She is working on a lower sodium diet as she continues to distance herself from COVID-19.  She is compliant with all medication.  Labs: 01/08/2018 -  Hgb 13.0, PLT 391, serum creatinine 0.78, potassium 4.4 06/2017 - Total cholesterol 125, triglyceride 103, HDL 43, LDL 61, AST/ALT normal, albumin 4.0   Past Medical History:  Diagnosis Date   Chronic diastolic CHF (congestive heart failure) (HCC)    a. TTE 1/19: EF of 50-55%, no RWMA, Gr1DD, mildly dilated left atrium, PASP could not be estimated   HLD (hyperlipidemia)     Hypertension    Hypertensive heart disease    a. TTE 1/19: EF 50-55%, no RWMA, Gr1DD, mildly dilated left atrium, PASP could not be estimated; b. MV 1/19: small defect of mild severity present in the apex location felt to be 2/2 breast attenuation, EF 45-54% felt to be 2/2 hypertensive heart disease. This was a normal study   Obesity     Past Surgical History:  Procedure Laterality Date   HAND SURGERY Left     Current Meds  Medication Sig   aspirin 81 MG EC tablet Take 1 tablet (81 mg total) by mouth daily. Swallow whole.   atorvastatin (LIPITOR) 40 MG tablet Take 1 tablet (40 mg total) by mouth daily at 6 PM.   hydrochlorothiazide (MICROZIDE) 12.5 MG capsule Take 2 capsules (25 mg total) by mouth daily.   lisinopril (PRINIVIL,ZESTRIL) 40 MG tablet Take 1 tablet (40 mg total) by mouth daily.   verapamil (CALAN-SR) 240 MG CR tablet Take 1 tablet (240 mg total) by mouth daily.   [DISCONTINUED] hydrochlorothiazide (MICROZIDE) 12.5 MG capsule Take 1 capsule (12.5 mg total) by mouth daily.    Allergies:   Other   Social History:  The patient  reports that she has never smoked. She has never used smokeless tobacco. She reports that she does not drink alcohol or use drugs.   Family History:  The patient's family history includes Cancer in her father; Hypertension in her father and mother.  ROS:   Review of Systems  Constitutional: Positive for malaise/fatigue. Negative for chills, diaphoresis, fever and weight loss.  HENT: Negative for congestion.   Eyes: Negative for discharge and redness.  Respiratory: Positive for shortness of breath. Negative for cough, hemoptysis, sputum production and wheezing.   Cardiovascular: Positive for leg swelling. Negative for chest pain, palpitations, orthopnea, claudication and PND.  Gastrointestinal: Negative for abdominal pain, blood in stool, heartburn, melena, nausea and vomiting.  Genitourinary: Negative for hematuria.  Musculoskeletal:  Negative for falls and myalgias.  Skin: Negative for rash.  Neurological: Positive for weakness. Negative for dizziness, tingling, tremors, sensory change, speech change, focal weakness and loss of consciousness.  Endo/Heme/Allergies: Does not bruise/bleed easily.  Psychiatric/Behavioral: Negative for substance abuse. The patient is not nervous/anxious.   All other systems reviewed and are negative.    PHYSICAL EXAM:  VS:  BP (!) 160/100 (BP Location: Left Arm, Patient Position: Sitting, Cuff Size: Large)    Pulse 75    Temp 97.6 F (36.4 C)    Ht 5\' 9"  (1.753 m)    Wt 277 lb 12 oz (126 kg)    SpO2 97%    BMI 41.02 kg/m  BMI: Body mass index is 41.02 kg/m.  Physical Exam  Constitutional: She is oriented to person, place, and time. She appears well-developed and well-nourished.  Recheck BP 161/99.  HENT:  Head: Normocephalic and atraumatic.  Eyes: Right eye exhibits no discharge. Left eye exhibits no discharge.  Neck: Normal range of motion. No JVD present.  JVD difficult to assess secondary to patient body habitus  Cardiovascular: Normal rate, regular rhythm, S1 normal,  S2 normal and normal heart sounds. Exam reveals no distant heart sounds, no friction rub, no midsystolic click and no opening snap.  No murmur heard. Pulmonary/Chest: Effort normal and breath sounds normal. No respiratory distress. She has no decreased breath sounds. She has no wheezes. She has no rales. She exhibits no tenderness.  Abdominal: Soft. She exhibits no distension. There is no abdominal tenderness.  Musculoskeletal:        General: Edema present.     Comments: Trace bilateral pretibial edema without warmth, erythema, or cording.  Neurological: She is alert and oriented to person, place, and time.  Skin: Skin is warm and dry. No cyanosis. Nails show no clubbing.  Psychiatric: She has a normal mood and affect. Her speech is normal and behavior is normal. Judgment and thought content normal.     EKG:  Was  ordered and interpreted by me today. Shows NSR, 75 bpm, LVH, nonspecific lateral ST-T changes  Recent Labs: 01/12/2018: BUN 16; Creatinine, Ser 0.78; Hemoglobin 13.0; Platelets 391; Potassium 4.4; Sodium 140  No results found for requested labs within last 8760 hours.   CrCl cannot be calculated (Patient's most recent lab result is older than the maximum 21 days allowed.).   Wt Readings from Last 3 Encounters:  10/17/18 277 lb 12 oz (126 kg)  04/22/18 249 lb (112.9 kg)  03/21/18 252 lb 8 oz (114.5 kg)     Other studies reviewed: Additional studies/records reviewed today include: summarized above  ASSESSMENT AND PLAN:  1. Hypertensive heart disease: Blood pressure is poorly controlled today at 160/110 with recheck at 161/99.  Possibly in the setting of increased stress with today's visit and recent COVID-19 infection.  She also has been eating foods higher in sodium.  Recommend low-sodium diet.  Add Coreg 6.25 mg twice daily with further titration as needed.  Increase HCTZ to 25 mg daily as outlined below.  Continue lisinopril 40 mg daily and verapamil 240 mg daily.  2. Diastolic dysfunction: Patient's weight is up 25 pounds when compared to her last office visit.  Volume status is somewhat difficult to assess based on physical exam secondary to body habitus.  Lungs are clear to auscultation bilaterally.  JVD is difficult to assess secondary to body habitus.  She does have trace bilateral pretibial lower extremity edema.  Obtain echocardiogram as outlined below evaluate for new onset cardiomyopathy following COVID-19 infection.  Check BNP though interpretation of results may be difficult secondary to patient's body habitus.  Increase HCTZ to 25 mg daily.  Optimize blood pressure regimen as outlined above.  Continue low-sodium diet.  3. Hyperlipidemia: LDL of 61 from 06/2017.  Remains on Lipitor 40 mg daily.  Check CMP, lipid panel, and direct LDL.  4. Morbid obesity: Weight is up 27 pounds  compared to her last office visit, which is up from a low of 238 by our scale from 10/2017.  5. Recent COVID-19/dyspnea: Tested positive 09/15/2018.  Pulse ox 97% on room air in the office today with heart rate 75 bpm.  Check echo to evaluate for new onset cardiomyopathy following COVID-19.  Check BNP, d-dimer, CRP, CBC, and sed rate.  If d-dimer is found to be elevated she will need to proceed to the ED for CTA chest/lower extremity ultrasound as there have been findings of PEs/DVTs several weeks after COVID diagnosis.  Disposition: F/u with Dr. Fletcher Anon or an APP in 2 weeks.  Current medicines are reviewed at length with the patient today.  The patient did not  have any concerns regarding medicines.  Signed, Eula Listenyan Melonee Gerstel, PA-C 10/17/2018 10:11 AM     [facility] [address] [place], [place] [address] [phone]

## 2018-10-17 ENCOUNTER — Encounter: Payer: Self-pay | Admitting: Physician Assistant

## 2018-10-17 ENCOUNTER — Ambulatory Visit (INDEPENDENT_AMBULATORY_CARE_PROVIDER_SITE_OTHER): Payer: PRIVATE HEALTH INSURANCE | Admitting: Physician Assistant

## 2018-10-17 ENCOUNTER — Other Ambulatory Visit: Payer: Self-pay

## 2018-10-17 VITALS — BP 160/100 | HR 75 | Temp 97.6°F | Ht 69.0 in | Wt 277.8 lb

## 2018-10-17 DIAGNOSIS — I5189 Other ill-defined heart diseases: Secondary | ICD-10-CM

## 2018-10-17 DIAGNOSIS — R0600 Dyspnea, unspecified: Secondary | ICD-10-CM

## 2018-10-17 DIAGNOSIS — I119 Hypertensive heart disease without heart failure: Secondary | ICD-10-CM | POA: Diagnosis not present

## 2018-10-17 DIAGNOSIS — U071 COVID-19: Secondary | ICD-10-CM | POA: Diagnosis not present

## 2018-10-17 DIAGNOSIS — E785 Hyperlipidemia, unspecified: Secondary | ICD-10-CM

## 2018-10-17 MED ORDER — HYDROCHLOROTHIAZIDE 12.5 MG PO CAPS: 25.0000 mg | ORAL_CAPSULE | Freq: Every day | ORAL | 3 refills | Status: DC

## 2018-10-17 MED ORDER — CARVEDILOL 6.25 MG PO TABS: 6.2500 mg | ORAL_TABLET | Freq: Two times a day (BID) | ORAL | 3 refills | Status: DC

## 2018-10-17 NOTE — Patient Instructions (Signed)
Medication Instructions:  Your physician has recommended you make the following change in your medication:  1- INCREASE HCTZ to 2 tablets (25 mg total) once daily 2- START Coreg Take 1 tablet (6.25 mg total) by mouth 2 (two) times daily If you need a refill on your cardiac medications before your next appointment, please call your pharmacy.   Lab work: Your physician recommends that you have lab work today(CBC, CRP, CMP, d dimer, BNP, sed rate)  If you have labs (blood work) drawn today and your tests are completely normal, you will receive your results only by: Marland Kitchen MyChart Message (if you have MyChart) OR . A paper copy in the mail If you have any lab test that is abnormal or we need to change your treatment, we will call you to review the results.  Testing/Procedures: 1- Echo  Please return to [facility] on ______________ at _______________ AM/PM for an Echocardiogram. Your physician has requested that you have an echocardiogram. Echocardiography is a painless test that uses sound waves to create images of your heart. It provides your doctor with information about the size and shape of your heart and how well your heart's chambers and valves are working. This procedure takes approximately one hour. There are no restrictions for this procedure. Please note; depending on visual quality an IV may need to be placed.    Follow-Up: At [facility], you and your health needs are our priority.  As part of our continuing mission to provide you with exceptional heart care, we have created designated Provider Care Teams.  These Care Teams include your primary Cardiologist (physician) and Advanced Practice Providers (APPs -  Physician Assistants and Nurse Practitioners) who all work together to provide you with the care you need, when you need it. You will need a follow up appointment in 2 weeks. You may see Dr. Fletcher Anon or Christell Faith, PA-C.

## 2018-10-18 ENCOUNTER — Telehealth: Payer: Self-pay

## 2018-10-18 ENCOUNTER — Other Ambulatory Visit: Payer: Self-pay

## 2018-10-18 ENCOUNTER — Ambulatory Visit (INDEPENDENT_AMBULATORY_CARE_PROVIDER_SITE_OTHER): Payer: PRIVATE HEALTH INSURANCE

## 2018-10-18 ENCOUNTER — Other Ambulatory Visit: Payer: Self-pay | Admitting: Physician Assistant

## 2018-10-18 ENCOUNTER — Ambulatory Visit
Admission: RE | Admit: 2018-10-18 | Discharge: 2018-10-18 | Disposition: A | Discharge status: Home / Self Care | Payer: PRIVATE HEALTH INSURANCE | Source: Ambulatory Visit | Attending: Physician Assistant | Admitting: Physician Assistant

## 2018-10-18 DIAGNOSIS — R0600 Dyspnea, unspecified: Secondary | ICD-10-CM | POA: Diagnosis not present

## 2018-10-18 DIAGNOSIS — R600 Localized edema: Secondary | ICD-10-CM | POA: Diagnosis not present

## 2018-10-18 DIAGNOSIS — I739 Peripheral vascular disease, unspecified: Secondary | ICD-10-CM

## 2018-10-18 LAB — COMPREHENSIVE METABOLIC PANEL
ALT: 11 IU/L (ref 0–32)
AST: 13 IU/L (ref 0–40)
Albumin/Globulin Ratio: 1.3 (ref 1.2–2.2)
Albumin: 3.8 g/dL (ref 3.8–4.9)
Alkaline Phosphatase: 92 IU/L (ref 39–117)
BUN/Creatinine Ratio: 17 (ref 9–23)
BUN: 15 mg/dL (ref 6–24)
Bilirubin Total: 0.3 mg/dL (ref 0.0–1.2)
CO2: 22 mmol/L (ref 20–29)
Calcium: 9.1 mg/dL (ref 8.7–10.2)
Chloride: 104 mmol/L (ref 96–106)
Creatinine, Ser: 0.9 mg/dL (ref 0.57–1.00)
GFR calc Af Amer: 81 mL/min/{1.73_m2} (ref >59)
GFR calc non Af Amer: 70 mL/min/{1.73_m2} (ref >59)
Globulin, Total: 3 g/dL (ref 1.5–4.5)
Glucose: 86 mg/dL (ref 65–99)
Potassium: 4.4 mmol/L (ref 3.5–5.2)
Sodium: 142 mmol/L (ref 134–144)
Total Protein: 6.8 g/dL (ref 6.0–8.5)

## 2018-10-18 LAB — SEDIMENTATION RATE: Sed Rate: 37 mm/hr (ref 0–40)

## 2018-10-18 LAB — LIPID PANEL
Chol/HDL Ratio: 3.4 ratio (ref 0.0–4.4)
Cholesterol, Total: 162 mg/dL (ref 100–199)
HDL: 47 mg/dL (ref >39)
LDL Calculated: 90 mg/dL (ref 0–99)
Triglycerides: 127 mg/dL (ref 0–149)
VLDL Cholesterol Cal: 25 mg/dL (ref 5–40)

## 2018-10-18 LAB — LDL CHOLESTEROL, DIRECT: LDL Direct: 98 mg/dL (ref 0–99)

## 2018-10-18 LAB — BRAIN NATRIURETIC PEPTIDE: BNP: 46.7 pg/mL (ref 0.0–100.0)

## 2018-10-18 LAB — D-DIMER, QUANTITATIVE: D-DIMER: 0.93 mg/L FEU — ABNORMAL HIGH (ref 0.00–0.49)

## 2018-10-18 LAB — C-REACTIVE PROTEIN: CRP: 6 mg/L (ref 0–10)

## 2018-10-18 MED ORDER — IOHEXOL 350 MG/ML SOLN
100.0000 mL | Freq: Once | INTRAVENOUS | Status: AC | PRN
  Administered 2018-10-18: 100 mL via INTRAVENOUS

## 2018-10-18 NOTE — Telephone Encounter (Signed)
-----   Message from Rise Mu, PA-C sent at 10/18/2018  3:53 PM EDT ----- CTA negative for PE.

## 2018-10-18 NOTE — Telephone Encounter (Signed)
Incoming call received from [place] at [facility].   She called to make sure we could see the CTA chest report in the patient's chart.  Advised I could see the report. This was negative for a PE.  The patient is coming in for LE venous dopplers in the office this afternoon at 4:00 pm.

## 2018-10-18 NOTE — Telephone Encounter (Signed)
Pt made aware of CT results with verbal understanding.  Awaiting LE dopp

## 2018-10-18 NOTE — Telephone Encounter (Signed)
CTA negative PE and has been resulted. Await lower extremity ultrasound to evaluate for DVT.

## 2018-10-18 NOTE — Telephone Encounter (Signed)
-----   Message from Rise Mu, PA-C sent at 10/18/2018 10:55 AM EDT ----- Please contact patient.  D dimer was mildly elevated and needs to be followed up on. Please order stat CTA chest to evaluate for PE as well as lower extremity doppler to evaluate for DVT.   Remaining labs showed normal renal function, potassium at goal, normal protein level, normal liver function, random glucose normal, LDL slightly higher at 90, and normal inflammatory markers. BNP remains pending.   No medication changes at this time, pending BNP. Work on getting back on track with heart healthy diet. Please have CTA reviewed by DOD as I may not be able to get back to computer later today.

## 2018-10-19 ENCOUNTER — Telehealth: Payer: Self-pay

## 2018-10-19 NOTE — Telephone Encounter (Signed)
-----   Message from Rise Mu, PA-C sent at 10/19/2018  7:04 AM EDT ----- Negative for DVT bilaterally.

## 2018-10-19 NOTE — Telephone Encounter (Signed)
Verbal from [employer], [place]. Pt had positive d dimer and was requested to have stat ct chest and LE dopplers. Orders placed and pt called for results d dimer and instructions for testing.   Pt verbalized understanding and went for testing.   At the time the patient asked me about extend staying out of work d/t these issues. I explained that we would need the results from the dopplers and CT to help in making that decision.   Reviewed results from LE doppler and CT. She verbalized understanding. We discussed her reaching out to her PCP for further advice on returning to work given the test were neg for PE>   Pt agreed. Advised pt to call for any further questions or concerns.

## 2018-11-02 NOTE — Progress Notes (Signed)
Cardiology Office Note    Date:  11/07/2018   ID:  Mindy Myers, DOB 10-Oct-1960, MRN [id number]  PCP:  [place]  Cardiologist:  Kathlyn Sacramento, MD  Electrophysiologist:  None   Chief Complaint: Follow up  History of Present Illness:   Mindy Myers is a 58 y.o. female with history of hypertensive heart disease with chronic diastolic CHF, HLD,osteoarthritis affecting the bilateral hands,COVID-19 infection in 08/2018, and obesity who presents for follow up of hypertension.  She was previously evaluated in 2013 for atypical chest pain, ruled out and underwent nuclear stress testing that was without evidence of ischemia. She reported being diagnosed with HTN years ago and had reportedly been managed with 3 antihypertensives. She had been out of these medications for several months 2/2 finances prior to her admission in early 03/2017. She was admitted to [facility] in 1/2065for chest pain and accelerated HTN with an initial BP of 202/86. Cardiac enzymes negative, BNP 83, EKG with sinus rhythm with LVH and repolarization abnormalities. Echo 03/28/17 showed EF of 50-55%, no RWMA, Gr1DD, mildly dilated left atrium, PASP could not be estimated. [place] 03/28/2017 showed a small defect of mild severity present in the apex location felt to be 2/2 breast attenuation, EF 45-54%, felt to be 2/2 hypertensive heart disease. This was a normal study. She was started on Lipitor, Coreg, irbesartan, and Lasix with a discharge BP of 141/48. Since her admission, she has done well. She has been keeping a detailed food diary with strict sodium restriction noted. Weight has been down trending (planned through lifestyle changes).  In late 2019 she noted changes in her work schedule and increase stress at home leading to some fluctuations in blood pressure with subsequent improvement following titration of antihypertensive therapy.  I last saw her in clinic in 02/2018 for routine follow-up.  She was doing  well from a cardiac perspective.  She had been seen by hand specialist with subsequent diagnosis of severe osteoarthritis affecting the bilateral hands.  They were trying conservative therapy with steroid injections to help alleviate her pain.  In this setting, she was stress eating and noted slight increase in weight as well as increase in BP.  Her verapamil was increased to 240 mg daily otherwise, she was continued on HCTZ 12.5 mg daily and lisinopril 40 mg daily.  Patient had to postpone her follow-up with me as she tested positive for COVID-19 on 09/15/2018. She was seen in follow up on 10/17/2018 with noted weight gain of 25 pounds when compared to her prior office visit in 02/2018 with associated fatigue and exertional SOB. BP was running mostly in the 073X to 106Y systolic. She was started on Coreg 6.25 mg bid and HCTZ was increased to 25 mg daily. We checked a D-dimer which was elevated leading to CTA chest on 10/18/2018 that was negative for PE and lower extremity ultrasound negative for DVT bilaterally. Labs were unrevealing as below, including CRP and sed rate. Echo on 11/03/2018 showed an EF of 50-55% (unchanged from 6948), diastolic dysfunction, mildly dilated LA, mild MR.   She comes in doing reasonably well today.  She continues to note some exertional shortness of breath and fatigue in the setting of her recent COVID-19 infection.  She does feel that this is slowly improving though does continue to persist.  She denies any exertional chest pain.  No lower extremity swelling, abdominal distention, orthopnea, PND, or early satiety.  Her appetite continues to be intermittent following her COVID-19  infection.  She denies any dizziness, presyncope, or syncope.  With the addition of Coreg and escalation of HCTZ her blood pressures are significantly improved with most readings in the low 100s to 1 teens systolic.  In this setting, she has noted some positional dizziness.  She continues to eat a diet low in  sodium.  She notes her 12-hour work shifts seem to be too much for her right now following her recent infection.   Labs: 09/2018 - SCr 0.90, K+ 4.4, albumin 3.8, AST/ALT normal, TC 162, TG 127, HDL 47, LDL 90, BNP 46,  01/08/2018 - Hgb 13.0, PLT 391   Past Medical History:  Diagnosis Date   Chronic diastolic CHF (congestive heart failure) (HCC)    a. TTE 1/19: EF of 50-55%, no RWMA, Gr1DD, mildly dilated left atrium, PASP could not be estimated   COVID-19 08/2018   HLD (hyperlipidemia)    Hypertension    Hypertensive heart disease    a. TTE 1/19: EF 50-55%, no RWMA, Gr1DD, mildly dilated left atrium, PASP could not be estimated; b. MV 1/19: small defect of mild severity present in the apex location felt to be 2/2 breast attenuation, EF 45-54% felt to be 2/2 hypertensive heart disease. This was a normal study   Obesity     Past Surgical History:  Procedure Laterality Date   HAND SURGERY Left     Current Medications: Current Meds  Medication Sig   aspirin 81 MG EC tablet Take 1 tablet (81 mg total) by mouth daily. Swallow whole.   atorvastatin (LIPITOR) 40 MG tablet Take 1 tablet (40 mg total) by mouth daily at 6 PM.   carvedilol (COREG) 6.25 MG tablet Take 1 tablet (6.25 mg total) by mouth 2 (two) times daily.   hydrochlorothiazide (MICROZIDE) 12.5 MG capsule Take 2 capsules (25 mg total) by mouth daily.   lisinopril (PRINIVIL,ZESTRIL) 40 MG tablet Take 1 tablet (40 mg total) by mouth daily.   verapamil (CALAN-SR) 240 MG CR tablet Take 1 tablet (240 mg total) by mouth daily.     Allergies:   Other   Social History   Socioeconomic History   Marital status: Single    Spouse name: Not on file   Number of children: 2   Years of education: 12   Highest education level: 12th grade  Occupational History   Not on file  Social Needs   Financial resource strain: Not hard at all   Food insecurity    Worry: Often true    Inability: Often true    Transportation needs    Medical: No    Non-medical: No  Tobacco Use   Smoking status: Never Smoker   Smokeless tobacco: Never Used  Substance and Sexual Activity   Alcohol use: No    Comment: Occassional   Drug use: No   Sexual activity: Never  Lifestyle   Physical activity    Days per week: 0 days    Minutes per session: 0 min   Stress: To some extent  Relationships   Social connections    Talks on phone: More than three times a week    Gets together: Never    Attends religious service: Never    Active member of club or organization: No    Attends meetings of clubs or organizations: Never    Relationship status: Divorced  Other Topics Concern   Not on file  Social History Narrative   Not on file     Family History:  The patient's family history includes Cancer in her father; Hypertension in her father and mother.  ROS:   Review of Systems  Constitutional: Positive for malaise/fatigue. Negative for chills, diaphoresis, fever and weight loss.  HENT: Negative for congestion.   Eyes: Negative for discharge and redness.  Respiratory: Positive for shortness of breath. Negative for cough, hemoptysis, sputum production and wheezing.   Cardiovascular: Negative for chest pain, palpitations, orthopnea, claudication, leg swelling and PND.  Gastrointestinal: Negative for abdominal pain, blood in stool, heartburn, melena, nausea and vomiting.  Genitourinary: Negative for hematuria.  Musculoskeletal: Negative for falls and myalgias.  Skin: Negative for rash.  Neurological: Positive for weakness. Negative for dizziness, tingling, tremors, sensory change, speech change, focal weakness and loss of consciousness.  Endo/Heme/Allergies: Does not bruise/bleed easily.  Psychiatric/Behavioral: Negative for substance abuse. The patient is not nervous/anxious.   All other systems reviewed and are negative.    EKGs/Labs/Other Studies Reviewed:    Studies reviewed were summarized  above. The additional studies were reviewed today:  2D Echo 11/03/2018: 1. The left ventricle has low normal systolic function, with an ejection fraction of 50-55%. The cavity size was normal. Left ventricular diastolic Doppler parameters are consistent with impaired relaxation.  2. The right ventricle has normal systolic function. The cavity was normal. There is no increase in right ventricular wall thickness. Unable to estimate RVSP.  3. Left atrial size was mildly dilated. __________  2D Echo 03/2017: - Left ventricle: The cavity size was mildly dilated. There was   moderate concentric hypertrophy. Systolic function was normal.   The estimated ejection fraction was in the range of 50% to 55%.   Wall motion was normal; there were no regional wall motion   abnormalities. Doppler parameters are consistent with abnormal   left ventricular relaxation (grade 1 diastolic dysfunction). - Left atrium: The atrium was mildly dilated. - Pulmonary arteries: Systolic pressure could not be accurately   estimated.   EKG:  EKG is ordered today.  The EKG ordered today demonstrates NSR, 79 bpm, LVH, nonspecific lateral ST-T changes  Recent Labs: 01/12/2018: Hemoglobin 13.0; Platelets 391 10/17/2018: ALT 11; BNP 46.7; BUN 15; Creatinine, Ser 0.90; Potassium 4.4; Sodium 142  Recent Lipid Panel    Component Value Date/Time   CHOL 162 10/17/2018 1010   TRIG 127 10/17/2018 1010   HDL 47 10/17/2018 1010   CHOLHDL 3.4 10/17/2018 1010   CHOLHDL 5.0 03/29/2017 0421   VLDL 26 03/29/2017 0421   LDLCALC 90 10/17/2018 1010   LDLDIRECT 98 10/17/2018 1010    PHYSICAL EXAM:    VS:  BP 100/62 (BP Location: Left Arm, Patient Position: Sitting, Cuff Size: Large)    Pulse 79    Ht 5\' 9"  (1.753 m)    Wt 275 lb 8 oz (125 kg)    BMI 40.68 kg/m   BMI: Body mass index is 40.68 kg/m.  Physical Exam  Constitutional: She is oriented to person, place, and time. She appears well-developed and well-nourished.  HENT:    Head: Normocephalic and atraumatic.  Eyes: Right eye exhibits no discharge. Left eye exhibits no discharge.  Neck: Normal range of motion. No JVD present.  Cardiovascular: Normal rate, regular rhythm, S1 normal, S2 normal and normal heart sounds. Exam reveals no distant heart sounds, no friction rub, no midsystolic click and no opening snap.  No murmur heard. Pulses:      Posterior tibial pulses are 2+ on the right side and 2+ on the left side.  Pulmonary/Chest: Effort  normal and breath sounds normal. No respiratory distress. She has no decreased breath sounds. She has no wheezes. She has no rales. She exhibits no tenderness.  Abdominal: Soft. She exhibits no distension. There is no abdominal tenderness.  Musculoskeletal:        General: No edema.  Neurological: She is alert and oriented to person, place, and time.  Skin: Skin is warm and dry. No cyanosis. Nails show no clubbing.  Psychiatric: She has a normal mood and affect. Her speech is normal and behavior is normal. Judgment and thought content normal.    Wt Readings from Last 3 Encounters:  11/07/18 275 lb 8 oz (125 kg)  10/17/18 277 lb 12 oz (126 kg)  04/22/18 249 lb (112.9 kg)     ASSESSMENT & PLAN:   1. Hypertensive heart disease: Blood pressure is very well controlled and slightly on the low side today.  She does note some positional dizziness with blood pressures in the low 100s systolic.  In this setting, we will reduce her HCTZ to 12.5 mg daily.  Otherwise, she will continue on carvedilol 6.25 mg twice daily, lisinopril 40 mg daily, and verapamil 240 mg daily.  Continue low-sodium diet and weight loss.  2. Diastolic dysfunction: She appears euvolemic and well compensated.  Her weight is up still compared to 1 year prior though this appears to be in the setting of more sedentary lifestyle following her COVID-19 infection.  Remains on HCTZ as outlined above.  Recent echo demonstrated stable, low normal LV systolic function with  grade 1 diastolic dysfunction.  Continue low-sodium diet.  3. Hyperlipidemia: LDL of 90 from 09/2018, which is increased from 61 from 06/2017.  Remains on Lipitor 40 mg daily.  4. Morbid obesity: Weight is down 2 pounds when compared to her visit in late 09/2018 though remains elevated from her weight 1 year prior.  5. Recent COVID-19 infection: Patient tested positive for COVID-19 on 09/15/2018.  When she was seen in follow-up in late 09/2018, she underwent extensive testing including laboratory analysis and imaging given complications that can be noted with COVID with imaging being unrevealing for PE/DVT.  Follow-up echo earlier this week did not show any new cardiomyopathy.  Given patient's continued, though slowly improving, dyspnea I offered her referral to pulmonology for further evaluation which was deferred at this time.  In follow-up, we will revisit this.  I have provided the patient with a letter recommending that her work shifts be no longer than 8 hours at a time for now as she continues to heal from her COVID-19 infection.  Disposition: F/u with me in 3 months.   Medication Adjustments/Labs and Tests Ordered: Current medicines are reviewed at length with the patient today.  Concerns regarding medicines are outlined above. Medication changes, Labs and Tests ordered today are summarized above and listed in the Patient Instructions accessible in Encounters.   Signed, Eula Listenyan Zeki Bedrosian, PA-C 11/07/2018 8:26 AM     [facility] [place] [address] [place], [place] [address] [phone]

## 2018-11-03 ENCOUNTER — Other Ambulatory Visit: Payer: Self-pay

## 2018-11-03 ENCOUNTER — Ambulatory Visit (INDEPENDENT_AMBULATORY_CARE_PROVIDER_SITE_OTHER): Payer: PRIVATE HEALTH INSURANCE

## 2018-11-03 DIAGNOSIS — R0600 Dyspnea, unspecified: Secondary | ICD-10-CM

## 2018-11-04 ENCOUNTER — Encounter: Payer: Self-pay | Admitting: Physician Assistant

## 2018-11-07 ENCOUNTER — Ambulatory Visit (INDEPENDENT_AMBULATORY_CARE_PROVIDER_SITE_OTHER): Payer: PRIVATE HEALTH INSURANCE | Admitting: Physician Assistant

## 2018-11-07 ENCOUNTER — Other Ambulatory Visit: Payer: Self-pay

## 2018-11-07 ENCOUNTER — Encounter: Payer: Self-pay | Admitting: Physician Assistant

## 2018-11-07 VITALS — BP 100/62 | HR 79 | Ht 69.0 in | Wt 275.5 lb

## 2018-11-07 DIAGNOSIS — I119 Hypertensive heart disease without heart failure: Secondary | ICD-10-CM

## 2018-11-07 DIAGNOSIS — E785 Hyperlipidemia, unspecified: Secondary | ICD-10-CM | POA: Diagnosis not present

## 2018-11-07 DIAGNOSIS — I5189 Other ill-defined heart diseases: Secondary | ICD-10-CM

## 2018-11-07 DIAGNOSIS — U071 COVID-19: Secondary | ICD-10-CM

## 2018-11-07 MED ORDER — HYDROCHLOROTHIAZIDE 12.5 MG PO CAPS: 12.5000 mg | ORAL_CAPSULE | Freq: Every day | ORAL | 3 refills | Status: DC

## 2018-11-07 NOTE — Patient Instructions (Signed)
Medication Instructions:  Your physician has recommended you make the following change in your medication:  1- DECREASE HCTZ Take 1 capsule (12.5 mg total) by mouth daily.  If you need a refill on your cardiac medications before your next appointment, please call your pharmacy.   Lab work: None ordered  If you have labs (blood work) drawn today and your tests are completely normal, you will receive your results only by: Marland Kitchen MyChart Message (if you have MyChart) OR . A paper copy in the mail If you have any lab test that is abnormal or we need to change your treatment, we will call you to review the results.  Testing/Procedures: None ordered   Follow-Up: At [facility], you and your health needs are our priority.  As part of our continuing mission to provide you with exceptional heart care, we have created designated Provider Care Teams.  These Care Teams include your primary Cardiologist (physician) and Advanced Practice Providers (APPs -  Physician Assistants and Nurse Practitioners) who all work together to provide you with the care you need, when you need it. You will need a follow up appointment in 3 months.  You may see Kathlyn Sacramento, MD or Christell Faith, PA-C.

## 2018-11-22 ENCOUNTER — Emergency Department: Payer: PRIVATE HEALTH INSURANCE

## 2018-11-22 ENCOUNTER — Other Ambulatory Visit: Payer: Self-pay

## 2018-11-22 ENCOUNTER — Emergency Department
Admission: EM | Admit: 2018-11-22 | Discharge: 2018-11-22 | Disposition: A | Discharge status: Home / Self Care | Payer: PRIVATE HEALTH INSURANCE | Attending: Emergency Medicine | Admitting: Emergency Medicine

## 2018-11-22 DIAGNOSIS — Y92008 Other place in unspecified non-institutional (private) residence as the place of occurrence of the external cause: Secondary | ICD-10-CM | POA: Diagnosis not present

## 2018-11-22 DIAGNOSIS — Z7982 Long term (current) use of aspirin: Secondary | ICD-10-CM | POA: Diagnosis not present

## 2018-11-22 DIAGNOSIS — Z79899 Other long term (current) drug therapy: Secondary | ICD-10-CM | POA: Diagnosis not present

## 2018-11-22 DIAGNOSIS — I5032 Chronic diastolic (congestive) heart failure: Secondary | ICD-10-CM | POA: Diagnosis not present

## 2018-11-22 DIAGNOSIS — W19XXXA Unspecified fall, initial encounter: Secondary | ICD-10-CM

## 2018-11-22 DIAGNOSIS — Y9301 Activity, walking, marching and hiking: Secondary | ICD-10-CM | POA: Insufficient documentation

## 2018-11-22 DIAGNOSIS — I110 Hypertensive heart disease with heart failure: Secondary | ICD-10-CM | POA: Insufficient documentation

## 2018-11-22 DIAGNOSIS — W109XXA Fall (on) (from) unspecified stairs and steps, initial encounter: Secondary | ICD-10-CM | POA: Diagnosis not present

## 2018-11-22 DIAGNOSIS — S8992XA Unspecified injury of left lower leg, initial encounter: Secondary | ICD-10-CM | POA: Diagnosis present

## 2018-11-22 DIAGNOSIS — Y999 Unspecified external cause status: Secondary | ICD-10-CM | POA: Diagnosis not present

## 2018-11-22 DIAGNOSIS — E785 Hyperlipidemia, unspecified: Secondary | ICD-10-CM | POA: Insufficient documentation

## 2018-11-22 DIAGNOSIS — S8002XA Contusion of left knee, initial encounter: Secondary | ICD-10-CM | POA: Diagnosis not present

## 2018-11-22 MED ORDER — TRAMADOL HCL 50 MG PO TABS: 50.0000 mg | ORAL_TABLET | Freq: Four times a day (QID) | ORAL | 0 refills | Status: AC | PRN

## 2018-11-22 MED ORDER — MELOXICAM 15 MG PO TABS: 15.0000 mg | ORAL_TABLET | Freq: Every day | ORAL | 1 refills | Status: AC

## 2018-11-22 NOTE — ED Provider Notes (Signed)
[facility] Emergency Department Provider Note  ____________________________________________  Time seen: Approximately 9:32 PM  I have reviewed the triage vital signs and the nursing notes.   HISTORY  Chief Complaint Fall    HPI Mindy Myers is a 58 y.o. female presents to the emergency department after a mechanical, non-syncopal fall.  Patient reports that she tripped over her dog's leash and hung onto the side of her porch as she slid down approximately 5 steps.  Patient states that she fell onto her left knee.  She has been able to ambulate since injury occurred.  She denies hitting her head or neck.  She denies chest pain, chest tightness, shortness of breath or abdominal pain.  No numbness or tingling in the lower extremities.  No abrasions or lacerations.  Patient has not noticed any hematomas.  No other alleviating measures have been attempted.        Past Medical History:  Diagnosis Date  . Chronic diastolic CHF (congestive heart failure) ([place])    a. TTE 1/19: EF of 50-55%, no RWMA, Gr1DD, mildly dilated left atrium, PASP could not be estimated  . COVID-19 08/2018  . HLD (hyperlipidemia)   . Hypertension   . Hypertensive heart disease    a. TTE 1/19: EF 50-55%, no RWMA, Gr1DD, mildly dilated left atrium, PASP could not be estimated; b. MV 1/19: small defect of mild severity present in the apex location felt to be 2/2 breast attenuation, EF 45-54% felt to be 2/2 hypertensive heart disease. This was a normal study  . Obesity     Patient Active Problem List   Diagnosis Date Noted  . HTN (hypertension) 04/05/2017  . CHF (congestive heart failure) ([place]) 03/27/2017    Past Surgical History:  Procedure Laterality Date  . HAND SURGERY Left     Prior to Admission medications   Medication Sig Start Date End Date Taking? Authorizing Provider  aspirin 81 MG EC tablet Take 1 tablet (81 mg total) by mouth daily. Swallow whole. 05/01/18   Dunn, Areta Haber,  PA-C  atorvastatin (LIPITOR) 40 MG tablet Take 1 tablet (40 mg total) by mouth daily at 6 PM. 05/01/18   Dunn, Areta Haber, PA-C  carvedilol (COREG) 6.25 MG tablet Take 1 tablet (6.25 mg total) by mouth 2 (two) times daily. 10/17/18   Dunn, Areta Haber, PA-C  hydrochlorothiazide (MICROZIDE) 12.5 MG capsule Take 1 capsule (12.5 mg total) by mouth daily. 11/07/18   Dunn, Areta Haber, PA-C  lisinopril (PRINIVIL,ZESTRIL) 40 MG tablet Take 1 tablet (40 mg total) by mouth daily. 05/01/18 10/17/27  Rise Mu, PA-C  meloxicam (MOBIC) 15 MG tablet Take 1 tablet (15 mg total) by mouth daily for 7 days. 11/22/18 11/29/18  Lannie Fields, PA-C  traMADol (ULTRAM) 50 MG tablet Take 1 tablet (50 mg total) by mouth every 6 (six) hours as needed for up to 3 days. 11/22/18 11/25/18  Lannie Fields, PA-C  verapamil (CALAN-SR) 240 MG CR tablet Take 1 tablet (240 mg total) by mouth daily. 05/01/18 05/01/19  Rise Mu, PA-C    Allergies Amoxicillin  Family History  Problem Relation Age of Onset  . Hypertension Mother   . Hypertension Father   . Cancer Father        Thyroid    Social History Social History   Tobacco Use  . Smoking status: Never Smoker  . Smokeless tobacco: Never Used  Substance Use Topics  . Alcohol use: No    Comment:  Occassional  . Drug use: No     Review of Systems  Constitutional: No fever/chills Eyes: No visual changes. No discharge ENT: No upper respiratory complaints. Cardiovascular: no chest pain. Respiratory: no cough. No SOB. Gastrointestinal: No abdominal pain.  No nausea, no vomiting.  No diarrhea.  No constipation. Genitourinary: Negative for dysuria. No hematuria Musculoskeletal: Patient has left knee pain.  Skin: Negative for rash, abrasions, lacerations, ecchymosis. Neurological: Negative for headaches, focal weakness or numbness. ____________________________________________   PHYSICAL EXAM:  VITAL SIGNS: ED Triage Vitals  Enc Vitals Group     BP 11/22/18 1821 (!) 145/70      Pulse Rate 11/22/18 1821 65     Resp 11/22/18 1821 17     Temp 11/22/18 1821 98.1 F (36.7 C)     Temp Source 11/22/18 1821 Oral     SpO2 11/22/18 1821 100 %     Weight 11/22/18 1822 270 lb (122.5 kg)     Height 11/22/18 1822 5\' 9"  (1.753 m)     Head Circumference --      Peak Flow --      Pain Score 11/22/18 1822 10     Pain Loc --      Pain Edu? --      Excl. in GC? --      Constitutional: Alert and oriented. Well appearing and in no acute distress. Eyes: Conjunctivae are normal. PERRL. EOMI. Head: Atraumatic. Cardiovascular: Normal rate, regular rhythm. Normal S1 and S2.  Good peripheral circulation. Respiratory: Normal respiratory effort without tachypnea or retractions. Lungs CTAB. Good air entry to the bases with no decreased or absent breath sounds. Musculoskeletal: Full range of motion to all extremities. No gross deformities appreciated.  No pain with internal and external rotation at the left hip.  Palpable dorsalis pedis pulse bilaterally and symmetrically. Neurologic:  Normal speech and language. No gross focal neurologic deficits are appreciated.  Skin:  Skin is warm, dry and intact. No rash noted. Psychiatric: Mood and affect are normal. Speech and behavior are normal. Patient exhibits appropriate insight and judgement.   ____________________________________________   LABS (all labs ordered are listed, but only abnormal results are displayed)  Labs Reviewed - No data to display ____________________________________________  EKG   ____________________________________________  RADIOLOGY I personally viewed and evaluated these images as part of my medical decision making, as well as reviewing the written report by the radiologist.  Dg Lumbar Spine 2-3 Views  Result Date: 11/22/2018 CLINICAL DATA:  Low back pain since a slip and fall on steps 2 days ago. Initial encounter. EXAM: LUMBAR SPINE - 2-3 VIEW COMPARISON:  None. FINDINGS: There is no fracture. Trace  retrolisthesis L1 on L2 and L2 on L3 noted. Loss of disc space height and endplate spurring are seen in the lower thoracic and upper lumbar spine. Facet arthropathy lobe lower lumbar spine noted. Paraspinous structures are unremarkable. IMPRESSION: No acute abnormality. Multilevel degenerative change. Electronically Signed   By: Drusilla Kanner M.D.   On: 11/22/2018 20:23   Dg Knee Complete 4 Views Left  Result Date: 11/22/2018 CLINICAL DATA:  Left knee pain since a slip and fall on steps 2 days ago. Initial encounter. EXAM: LEFT KNEE - COMPLETE 4+ VIEW COMPARISON:  None. FINDINGS: No evidence of fracture, dislocation, or joint effusion. No focal bone abnormality. Small osteophytes about the medial compartment noted. Soft tissues are unremarkable. IMPRESSION: No acute abnormality. Electronically Signed   By: Drusilla Kanner M.D.   On: 11/22/2018 20:22  Dg Hip Unilat W Or Wo Pelvis 2-3 Views Left  Result Date: 11/22/2018 CLINICAL DATA:  Left hip pain since a slip and fall on steps 2 days ago. Initial encounter. EXAM: DG HIP (WITH OR WITHOUT PELVIS) 2-3V LEFT COMPARISON:  None. FINDINGS: There is no evidence of hip fracture or dislocation. There is no evidence of arthropathy or other focal bone abnormality. IMPRESSION: Negative exam. Electronically Signed   By: Drusilla Kannerhomas  Dalessio M.D.   On: 11/22/2018 20:22    ____________________________________________    PROCEDURES  Procedure(s) performed:    Procedures    Medications - No data to display   ____________________________________________   INITIAL IMPRESSION / ASSESSMENT AND PLAN / ED COURSE  Pertinent labs & imaging results that were available during my care of the patient were reviewed by me and considered in my medical decision making (see chart for details).  Review of the [place] CSRS was performed in accordance of the [facility] prior to dispensing any controlled drugs.         Assessment and plan Fall 58 year old female presents to  the emergency department after a mechanical, non-syncopal fall.  She is reporting left knee pain and left hip pain.  On physical exam, patient has no deficits appreciated with provocative testing at the left knee and has no pain with internal and external rotation at the left hip.  X-ray examination of the left hip, left knee and lumbar spine revealed no bony abnormality.  Patient was discharged with tramadol and meloxicam.  She was advised to follow-up with primary care as needed.  All patient questions were answered.   ____________________________________________  FINAL CLINICAL IMPRESSION(S) / ED DIAGNOSES  Final diagnoses:  Fall, initial encounter  Contusion of left knee, initial encounter      NEW MEDICATIONS STARTED DURING THIS VISIT:  ED Discharge Orders         Ordered    traMADol (ULTRAM) 50 MG tablet  Every 6 hours PRN     11/22/18 2038    meloxicam (MOBIC) 15 MG tablet  Daily     11/22/18 2039              This chart was dictated using voice recognition software/Dragon. Despite best efforts to proofread, errors can occur which can change the meaning. Any change was purely unintentional.    Gasper LloydWoods, Masie Bermingham M, PA-C 11/22/18 2135    Phineas SemenGoodman, [place], MD 11/22/18 [phone]

## 2018-11-22 NOTE — ED Notes (Signed)
Pt has left knee pain.  Pt fell down 5 steps outside 2 days ago.  States painful to walk.  Pt alert.

## 2018-11-22 NOTE — ED Triage Notes (Signed)
Pt states she slipped and fell on her porch steps 2 days ago and has been having left leg and lower back pain since. Pt is able to ambulate with pain

## 2018-11-23 ENCOUNTER — Telehealth: Payer: Self-pay | Admitting: Physician Assistant

## 2018-11-23 NOTE — Telephone Encounter (Signed)
Yes those are fine

## 2018-11-23 NOTE — Telephone Encounter (Signed)
Patient calling Patient was prescribed medications in ER yesterday Patient would like to know if these medications will be ok to take along with current heart medications Please call to discuss

## 2018-11-23 NOTE — Telephone Encounter (Signed)
Pt seen in ED last night for fall. Prescribed meloxicam and tramadol. She wants to make sure these medications are safe to take with current cardiac meds.   Routed to Dr. Fletcher Anon for review.

## 2018-11-24 NOTE — Telephone Encounter (Signed)
Spoke with patient and reviewed provider approved both and she had no further questions at this time.

## 2018-12-04 DIAGNOSIS — S86919A Strain of unspecified muscle(s) and tendon(s) at lower leg level, unspecified leg, initial encounter: Secondary | ICD-10-CM

## 2018-12-04 HISTORY — DX: Strain of unspecified muscle(s) and tendon(s) at lower leg level, unspecified leg, initial encounter: S86.919A

## 2019-01-06 ENCOUNTER — Encounter: Payer: Self-pay | Admitting: Emergency Medicine

## 2019-01-06 ENCOUNTER — Other Ambulatory Visit: Payer: Self-pay

## 2019-01-06 ENCOUNTER — Emergency Department
Admission: EM | Admit: 2019-01-06 | Discharge: 2019-01-06 | Disposition: A | Discharge status: Home / Self Care | Payer: PRIVATE HEALTH INSURANCE | Attending: Emergency Medicine | Admitting: Emergency Medicine

## 2019-01-06 DIAGNOSIS — I509 Heart failure, unspecified: Secondary | ICD-10-CM | POA: Diagnosis not present

## 2019-01-06 DIAGNOSIS — I10 Essential (primary) hypertension: Secondary | ICD-10-CM

## 2019-01-06 DIAGNOSIS — Z7982 Long term (current) use of aspirin: Secondary | ICD-10-CM | POA: Insufficient documentation

## 2019-01-06 DIAGNOSIS — I110 Hypertensive heart disease with heart failure: Secondary | ICD-10-CM | POA: Diagnosis present

## 2019-01-06 DIAGNOSIS — Z79899 Other long term (current) drug therapy: Secondary | ICD-10-CM | POA: Insufficient documentation

## 2019-01-06 MED ORDER — CARVEDILOL 6.25 MG PO TABS
6.2500 mg | ORAL_TABLET | Freq: Once | ORAL | Status: AC
  Administered 2019-01-06: 6.25 mg via ORAL
  Filled 2019-01-06: qty 1

## 2019-01-06 NOTE — ED Provider Notes (Signed)
[facility] Emergency Department Provider Note  ____________________________________________   I have reviewed the triage vital signs and the nursing notes.   HISTORY  Chief Complaint Hypertension   History limited by: Not Limited   HPI Mindy Myers is a 58 y.o. female who presents to the emergency department today because of concern for elevated blood pressure. The patient states that she checked her blood pressure this morning and noted that it was elevated. She does usually check her blood pressure multiple times a day. This afternoon when she was sitting down she checked it again and it was still elevated. She then thinks she started to become anxious about her blood pressure. She did have a slight headache but says that that has now resolved.    Records reviewed. Per medical record review patient has a history of HTN, CHF  Past Medical History:  Diagnosis Date  . Chronic diastolic CHF (congestive heart failure) ([place])    a. TTE 1/19: EF of 50-55%, no RWMA, Gr1DD, mildly dilated left atrium, PASP could not be estimated  . COVID-19 08/2018  . HLD (hyperlipidemia)   . Hypertension   . Hypertensive heart disease    a. TTE 1/19: EF 50-55%, no RWMA, Gr1DD, mildly dilated left atrium, PASP could not be estimated; b. MV 1/19: small defect of mild severity present in the apex location felt to be 2/2 breast attenuation, EF 45-54% felt to be 2/2 hypertensive heart disease. This was a normal study  . Obesity     Patient Active Problem List   Diagnosis Date Noted  . HTN (hypertension) 04/05/2017  . CHF (congestive heart failure) ([place]) 03/27/2017    Past Surgical History:  Procedure Laterality Date  . HAND SURGERY Left     Prior to Admission medications   Medication Sig Start Date End Date Taking? Authorizing Provider  aspirin 81 MG EC tablet Take 1 tablet (81 mg total) by mouth daily. Swallow whole. 05/01/18   Dunn, Areta Haber, PA-C  atorvastatin (LIPITOR)  40 MG tablet Take 1 tablet (40 mg total) by mouth daily at 6 PM. 05/01/18   Dunn, Areta Haber, PA-C  carvedilol (COREG) 6.25 MG tablet Take 1 tablet (6.25 mg total) by mouth 2 (two) times daily. 10/17/18   Dunn, Areta Haber, PA-C  hydrochlorothiazide (MICROZIDE) 12.5 MG capsule Take 1 capsule (12.5 mg total) by mouth daily. 11/07/18   Dunn, Areta Haber, PA-C  lisinopril (PRINIVIL,ZESTRIL) 40 MG tablet Take 1 tablet (40 mg total) by mouth daily. 05/01/18 10/17/27  Rise Mu, PA-C  verapamil (CALAN-SR) 240 MG CR tablet Take 1 tablet (240 mg total) by mouth daily. 05/01/18 05/01/19  Rise Mu, PA-C    Allergies Amoxicillin  Family History  Problem Relation Age of Onset  . Hypertension Mother   . Hypertension Father   . Cancer Father        Thyroid    Social History Social History   Tobacco Use  . Smoking status: Never Smoker  . Smokeless tobacco: Never Used  Substance Use Topics  . Alcohol use: No    Comment: Occassional  . Drug use: No    Review of Systems Constitutional: No fever/chills Eyes: No visual changes. ENT: No sore throat. Cardiovascular: Denies chest pain. Respiratory: Denies shortness of breath. Gastrointestinal: No abdominal pain.  No nausea, no vomiting.  No diarrhea.   Genitourinary: Negative for dysuria. Musculoskeletal: Negative for back pain. Skin: Negative for rash. Neurological: Positive for headache now resolved.   ____________________________________________  PHYSICAL EXAM:  VITAL SIGNS: ED Triage Vitals  Enc Vitals Group     BP 01/06/19 1733 (!) 221/100     Pulse Rate 01/06/19 1733 76     Resp 01/06/19 1733 (!) 24     Temp 01/06/19 1733 98 F (36.7 C)     Temp Source 01/06/19 1733 Oral     SpO2 01/06/19 1733 97 %     Weight 01/06/19 1734 270 lb (122.5 kg)     Height 01/06/19 1734 5\' 9"  (1.753 m)     Head Circumference --      Peak Flow --      Pain Score 01/06/19 1734 0   Constitutional: Alert and oriented.  Eyes: Conjunctivae are normal.  ENT       Head: Normocephalic and atraumatic.      Nose: No congestion/rhinnorhea.      Mouth/Throat: Mucous membranes are moist.      Neck: No stridor. Hematological/Lymphatic/Immunilogical: No cervical lymphadenopathy. Cardiovascular: Normal rate, regular rhythm.  No murmurs, rubs, or gallops.  Respiratory: Normal respiratory effort without tachypnea nor retractions. Breath sounds are clear and equal bilaterally. No wheezes/rales/rhonchi. Gastrointestinal: Soft and non tender. No rebound. No guarding.  Genitourinary: Deferred Musculoskeletal: Normal range of motion in all extremities. No lower extremity edema. Neurologic:  Normal speech and language. No gross focal neurologic deficits are appreciated.  Skin:  Skin is warm, dry and intact. No rash noted. Psychiatric: Mood and affect are normal. Speech and behavior are normal. Patient exhibits appropriate insight and judgment.  ____________________________________________    LABS (pertinent positives/negatives)  None  ____________________________________________   EKG  I, 01/08/19, attending physician, personally viewed and interpreted this EKG  EKG Time: 1732 Rate: 69 Rhythm: sinus rhythm Axis: normal Intervals: qtc 423 QRS: narrow, LVH ST changes: no st elevation Impression: abnormal ekg  ____________________________________________    RADIOLOGY  None ____________________________________________   PROCEDURES  Procedures  ____________________________________________   INITIAL IMPRESSION / ASSESSMENT AND PLAN / ED COURSE  Pertinent labs & imaging results that were available during my care of the patient were reviewed by me and considered in my medical decision making (see chart for details).   Patient presented to the emergency department today because of concern for high blood pressure. The patient denies any significant symptoms. Patient was given home dose of coreg and blood pressure did come down. Do wonder  if the blood pressure was getting more elevated because of patient anxiety. Discussed this with the patient. Will plan on discharging.  ____________________________________________   FINAL CLINICAL IMPRESSION(S) / ED DIAGNOSES  Final diagnoses:  Hypertension, unspecified type     Note: This dictation was prepared with Dragon dictation. Any transcriptional errors that result from this process are unintentional     Phineas Semen, MD 01/06/19 2044

## 2019-01-06 NOTE — ED Triage Notes (Signed)
Pt via EMS from home. Pt c/o HTN . Pt states this started after her cortisone shot from 3 day ago. Pt states she had a similar reaction 6 months ago but her BP came down hours ago. Pt has been taking HTN as prescribed. Pt also c/o of SOB with exertion for the last hour. Pt had a + COVID test in July.   EMS vital: 190/110 and 98% on RA

## 2019-01-06 NOTE — ED Notes (Addendum)
Instructed by Dr. Archie Balboa, to do non-pharmacological interventions to help calm the patient down.

## 2019-01-06 NOTE — ED Notes (Signed)
Pt given another warm blanket, remote for TV, and lights are dim to help the pt relax.

## 2019-01-06 NOTE — Discharge Instructions (Addendum)
Please seek medical attention for any high fevers, chest pain, shortness of breath, change in behavior, persistent vomiting, bloody stool or any other new or concerning symptoms.  

## 2019-01-06 NOTE — ED Notes (Signed)
No peripheral IV placed this visit.    Discharge instructions reviewed with patient. Questions fielded by this RN. Patient verbalizes understanding of instructions. Patient discharged home in stable condition per goodman. No acute distress noted at time of discharge.    

## 2019-02-02 NOTE — Progress Notes (Signed)
Cardiology Office Note    Date:  02/05/2019   ID:  Mindy Myers, DOB Aug 08, 1960, MRN [id number]  PCP:  [facility]  Cardiologist:  Lorine Bears, MD  Electrophysiologist:  None   Chief Complaint: Follow-up  History of Present Illness:   Mindy Myers is a 58 y.o. female with history of hypertensive heart disease with chronic diastolic CHF, HLD,osteoarthritis affecting the bilateral hands,COVID-19 infection in 08/2018, and obesity who presents for follow up of hypertension.  She was previously evaluated in 2013 for atypical chest pain, ruled out and underwent nuclear stress testing that was without evidence of ischemia. She reported being diagnosed with HTN years ago and had reportedly been managed with 3 antihypertensives. She had been out of these medications for several months 2/2 finances prior to her admission in early 03/2017.[facility] and [facility] HTN with an initial BP of 202/86. Cardiac enzymes negative, BNP 83, EKG with sinus rhythm with LVH and repolarization abnormalities. Echo 03/28/17 showed EF of 50-55%, no RWMA, Gr1DD, mildly dilated left atrium, PASP could not be estimated. Lexiscan Myoview 03/28/2017 showed a small defect of mild severity present in the apex location felt to be 2/2 breast attenuation, EF 45-54%, felt to be 2/2 hypertensive heart disease. This was a normal study. She was started on Lipitor, Coreg, irbesartan, and Lasix with a discharge BP of 141/48. Since her admission, she has done well.She hasbeen keeping a detailed food diary with strict sodium restriction noted. Weight has been down trending (planned through lifestyle changes). In late 2019 she noted changes in her work schedule and increase stress at home leading to some fluctuations in blood pressure with subsequent improvement following titration of antihypertensive therapy.I last saw her in clinic in 02/2018 for routine follow-up. She was doing  well from a cardiac perspective. She had been seen by hand specialist with subsequent diagnosis of severe osteoarthritis affecting the bilateral hands. They were trying conservative therapy with steroid injections to help alleviate her pain. In this setting, she was stress eating and noted slight increase in weight as well as increase in BP.Her verapamil was increased to 240 mg daily otherwise, she was continued on HCTZ 12.5 mg daily and lisinopril 40 mg daily. Patient had to postpone her follow-up with me as she tested positive for COVID-19 on 09/15/2018. She was seen in follow up on 10/17/2018 with noted weight gain of 25 pounds when compared to her prior office visit in 02/2018 with associated fatigue and exertional SOB. BP was running mostly in the 120s to 150s systolic. She was started on Coreg 6.25 mg bid and HCTZ was increased to 25 mg daily. We checked a D-dimer which was elevated leading to CTA chest on 10/18/2018 that was negative for PE and lower extremity ultrasound negative for DVT bilaterally. Labs were unrevealing as below, including CRP and sed rate. Echo on 11/03/2018 showed an EF of 50-55% (unchanged from 2019), diastolic dysfunction, mildly dilated LA, mild MR.  She was last seen in the office in 10/2018 and was doing reasonably well.  She continued to note some exertional shortness of breath and fatigue in the setting of her recent COVID-19 infection, though felt like this was improving.  She denied any exertional chest pain.  BP had significantly improved with the addition of carvedilol and escalation of HCTZ with most readings in the low 100s to 1 teens systolic.  With this, she did note some positional dizziness.  Given this, her HCTZ was  reduced to 12.5 mg daily.  Otherwise, she was continued on carvedilol 6.25 mg twice daily, lisinopril 40 mg daily, and verapamil 240 mg daily.  She was seen in the ED on 11/22/2018 with a mechanical fall after tripping over her dog's leash.  She was most  recently seen in the ED on 01/06/2019 with concern for elevated BP.  Blood pressure noted to be 221/100 in the ED.  EKG showed sinus rhythm with LVH with no acute ST-T changes.  Imaging was not obtained.  Patient was given home dose of carvedilol with improvement in readings.  She comes in doing well from a cardiac perspective.  She denies any chest pain, shortness of breath, palpitations, dizziness, presyncope, syncope.  No lower extremity swelling, abdominal surgeon, orthopnea, PND, early satiety.  She continues to eat a diet low in sodium.  Unfortunately, her BP cuff broke on 01/18/2019 and has not been able to check her blood pressure since.  However, provided BP readings available for review ranged from the 1 teens to 140s predominantly with a rare hypotensive reading in the 90s systolic back in September.  She indicates to me she did not suffer a mechanical fall when she was seen in the ED on 9/2 as above.  She states she developed low back pain with radiculopathy down the left leg which she has attributed to her recent Covid infection.  She has subsequently been evaluated by orthopedics and has a knee brace in place.  She is working with physical therapy with significant improvement in strength of her left leg.  She was also given a steroid injection into the knee shortly before her evaluation in the ED in 12/2018 with elevated BP.  She feels like her elevated BP was in the setting of pain in the left knee as well as the recent steroid injection.  She has been compliant with all medications.  She continues to deal with myalgias that she attributes to her prior COVID-19 infection.  She does not have any issues or concerns today.   Labs: 09/2018 - SCr 0.90, K+ 4.4, albumin 3.8, AST/ALT normal, TC 162, TG 127, HDL 47, LDL 90, BNP 46,  01/08/2018 -Hgb 13.0, PLT 391  Past Medical History:  Diagnosis Date   Chronic diastolic CHF (congestive heart failure) ([facility])    a. TTE 1/19: EF of 50-55%, no RWMA,  Gr1DD, mildly dilated left atrium, PASP could not be estimated   COVID-19 08/2018   HLD (hyperlipidemia)    Hypertension    Hypertensive heart disease    a. TTE 1/19: EF 50-55%, no RWMA, Gr1DD, mildly dilated left atrium, PASP could not be estimated; b. MV 1/19: small defect of mild severity present in the apex location felt to be 2/2 breast attenuation, EF 45-54% felt to be 2/2 hypertensive heart disease. This was a normal study   Obesity     Past Surgical History:  Procedure Laterality Date   HAND SURGERY Left     Current Medications: Current Meds  Medication Sig   aspirin 81 MG EC tablet Take 1 tablet (81 mg total) by mouth daily. Swallow whole.   atorvastatin (LIPITOR) 40 MG tablet Take 1 tablet (40 mg total) by mouth daily at 6 PM.   carvedilol (COREG) 6.25 MG tablet Take 1 tablet (6.25 mg total) by mouth 2 (two) times daily.   hydrochlorothiazide (MICROZIDE) 12.5 MG capsule Take 1 capsule (12.5 mg total) by mouth daily.   lisinopril (PRINIVIL,ZESTRIL) 40 MG tablet Take 1 tablet (40 mg  total) by mouth daily.   verapamil (CALAN-SR) 240 MG CR tablet Take 1 tablet (240 mg total) by mouth daily.    Allergies:   Amoxicillin   Social History   Socioeconomic History   Marital status: Single    Spouse name: Not on file   Number of children: 2   Years of education: 12   Highest education level: 12th grade  Occupational History   Not on file  Social Needs   Financial resource strain: Not hard at all   Food insecurity    Worry: Often true    Inability: Often true   Transportation needs    Medical: No    Non-medical: No  Tobacco Use   Smoking status: Never Smoker   Smokeless tobacco: Never Used  Substance and Sexual Activity   Alcohol use: No    Comment: Occassional   Drug use: No   Sexual activity: Never  Lifestyle   Physical activity    Days per week: 0 days    Minutes per session: 0 min   Stress: To some extent  Relationships   Social  connections    Talks on phone: More than three times a week    Gets together: Never    Attends religious service: Never    Active member of club or organization: No    Attends meetings of clubs or organizations: Never    Relationship status: Divorced  Other Topics Concern   Not on file  Social History Narrative   Not on file     Family History:  The patient's family history includes Cancer in her father; Hypertension in her father and mother.  ROS:   Review of Systems  Constitutional: Positive for malaise/fatigue. Negative for chills, diaphoresis, fever and weight loss.  HENT: Negative for congestion.   Eyes: Negative for discharge and redness.  Respiratory: Negative for cough, hemoptysis, sputum production, shortness of breath and wheezing.   Cardiovascular: Negative for chest pain, palpitations, orthopnea, claudication, leg swelling and PND.  Gastrointestinal: Negative for abdominal pain, blood in stool, heartburn, melena, nausea and vomiting.  Genitourinary: Negative for hematuria.  Musculoskeletal: Positive for joint pain and myalgias. Negative for falls.  Skin: Negative for rash.  Neurological: Negative for dizziness, tingling, tremors, sensory change, speech change, focal weakness, loss of consciousness and weakness.  Endo/Heme/Allergies: Does not bruise/bleed easily.  Psychiatric/Behavioral: Negative for substance abuse. The patient is not nervous/anxious.   All other systems reviewed and are negative.    EKGs/Labs/Other Studies Reviewed:    Studies reviewed were summarized above. The additional studies were reviewed today:  2D echo 10/2018: 1. The left ventricle has low normal systolic function, with an ejection fraction of 50-55%. The cavity size was normal. Left ventricular diastolic Doppler parameters are consistent with impaired relaxation.  2. The right ventricle has normal systolic function. The cavity was normal. There is no increase in right ventricular wall  thickness. Unable to estimate RVSP.  3. Left atrial size was mildly dilated.   EKG:  EKG is ordered today.  The EKG ordered today demonstrates NSR, 61 bpm, LVH, cannot exclude prior inferior/anterolateral infarct, nonspecific ST-T changes  Recent Labs: 10/17/2018: ALT 11; BNP 46.7; BUN 15; Creatinine, Ser 0.90; Potassium 4.4; Sodium 142  Recent Lipid Panel    Component Value Date/Time   CHOL 162 10/17/2018 1010   TRIG 127 10/17/2018 1010   HDL 47 10/17/2018 1010   CHOLHDL 3.4 10/17/2018 1010   CHOLHDL 5.0 03/29/2017 0421   VLDL 26 03/29/2017  0421   LDLCALC 90 10/17/2018 1010   LDLDIRECT 98 10/17/2018 1010    PHYSICAL EXAM:    VS:  BP 110/70 (BP Location: Left Arm, Patient Position: Sitting, Cuff Size: Large)    Pulse 67    Temp (!) 97 F (36.1 C)    Ht  (1.753 m)    Wt 283 lb 8 oz (128.6 kg)    SpO2 96%    BMI 41.87 kg/m   BMI: Body mass index is 41.87 kg/m.  Physical Exam  Constitutional: She is oriented to person, place, and time. She appears well-developed and well-nourished.  HENT:  Head: Normocephalic and atraumatic.  Eyes: Right eye exhibits no discharge. Left eye exhibits no discharge.  Neck: Normal range of motion. No JVD present.  Cardiovascular: Normal rate, regular rhythm, S1 normal, S2 normal and normal heart sounds. Exam reveals no distant heart sounds, no friction rub, no midsystolic click and no opening snap.  No murmur heard. Pulses:      Posterior tibial pulses are 2+ on the right side and 2+ on the left side.  Pulmonary/Chest: Effort normal and breath sounds normal. No respiratory distress. She has no decreased breath sounds. She has no wheezes. She has no rales. She exhibits no tenderness.  Abdominal: Soft. She exhibits no distension. There is no abdominal tenderness.  Musculoskeletal:        General: No edema.     Comments: Left knee brace in place.  Right wrist brace in place.  Ambulating with a cane.  Neurological: She is alert and oriented to  person, place, and time.  Skin: Skin is warm and dry. No cyanosis. Nails show no clubbing.  Psychiatric: She has a normal mood and affect. Her speech is normal and behavior is normal. Judgment and thought content normal.    Wt Readings from Last 3 Encounters:  02/05/19 283 lb 8 oz (128.6 kg)  01/06/19 270 lb (122.5 kg)  11/22/18 270 lb (122.5 kg)     ASSESSMENT & PLAN:   1. Hypertensive heart disease: Blood pressure is well controlled in the office today and reasonably controlled at home.  I suspect her recent ED visit for elevated BP is in the setting of left knee pain followed by recent steroid injection.  Continue low-sodium diet and current medications including carvedilol, HCTZ, lisinopril, and verapamil.  2. Diastolic dysfunction: She appears euvolemic and well compensated.  Her weight is up slightly though she has been more sedentary given her recent COVID-19 infection and with left knee pain.  Remains on HCTZ as outlined above.  Recent echo demonstrated stable, low normal LV systolic function with grade 1 diastolic dysfunction.  Low-sodium diet was continued to be recommended.  3. HLD: LDL of 90 from 09/2018.  Remains on Lipitor 40 mg daily.  4. Morbid obesity: Weight loss is recommended.  5. Recent COVID-19 infection: She continues to deal with some stress and anxiety related to this.  She continues to note intermittent low back and lower extremity myalgias.  Postinfection echo demonstrated preserved LV systolic function.  Postinfection imaging including lower extremity Doppler and CTA chest was negative for DVT/PE respectively.  Disposition: F/u with me in 6 months.   Medication Adjustments/Labs and Tests Ordered: Current medicines are reviewed at length with the patient today.  Concerns regarding medicines are outlined above. Medication changes, Labs and Tests ordered today are summarized above and listed in the Patient Instructions accessible in Encounters.   Signed, Eula Listen, PA-C 02/05/2019 10:36 AM  [address] [place], [place] [address] [phone]

## 2019-02-05 ENCOUNTER — Ambulatory Visit (INDEPENDENT_AMBULATORY_CARE_PROVIDER_SITE_OTHER): Payer: PRIVATE HEALTH INSURANCE | Admitting: Physician Assistant

## 2019-02-05 ENCOUNTER — Other Ambulatory Visit: Payer: Self-pay

## 2019-02-05 ENCOUNTER — Encounter: Payer: Self-pay | Admitting: Physician Assistant

## 2019-02-05 VITALS — BP 110/70 | HR 67 | Temp 97.0°F | Ht 69.0 in | Wt 283.5 lb

## 2019-02-05 DIAGNOSIS — E785 Hyperlipidemia, unspecified: Secondary | ICD-10-CM

## 2019-02-05 DIAGNOSIS — I5189 Other ill-defined heart diseases: Secondary | ICD-10-CM

## 2019-02-05 DIAGNOSIS — U071 COVID-19: Secondary | ICD-10-CM | POA: Diagnosis not present

## 2019-02-05 DIAGNOSIS — I119 Hypertensive heart disease without heart failure: Secondary | ICD-10-CM | POA: Diagnosis not present

## 2019-02-05 NOTE — Patient Instructions (Signed)
Medication Instructions:   1. Your physician recommends that you continue on your current medications as directed. Please refer to the Current Medication list given to you today.  *If you need a refill on your cardiac medications before your next appointment, please call your pharmacy*  Lab Work:  1. None Ordered.  If you have labs (blood work) drawn today and your tests are completely normal, you will receive your results only by: Marland Kitchen MyChart Message (if you have MyChart) OR . A paper copy in the mail If you have any lab test that is abnormal or we need to change your treatment, we will call you to review the results.  Testing/Procedures:  1. None Ordered.   Follow-Up: At [facility], you and your health needs are our priority.  As part of our continuing mission to provide you with exceptional heart care, we have created designated Provider Care Teams.  These Care Teams include your primary Cardiologist (physician) and Advanced Practice Providers (APPs -  Physician Assistants and Nurse Practitioners) who all work together to provide you with the care you need, when you need it.  Your next appointment:   6 months  The format for your next appointment:   In Person  Provider:    You may see Kathlyn Sacramento, MD or one of the following Advanced Practice Providers on your designated Care Team:    Murray Hodgkins, NP  Christell Faith, PA-C  Marrianne Mood, PA-C

## 2019-03-08 ENCOUNTER — Other Ambulatory Visit: Payer: Self-pay | Admitting: Physician Assistant

## 2019-03-25 ENCOUNTER — Other Ambulatory Visit: Payer: Self-pay | Admitting: Physician Assistant

## 2019-03-26 ENCOUNTER — Other Ambulatory Visit: Payer: Self-pay

## 2019-03-26 MED ORDER — VERAPAMIL HCL ER 240 MG PO TBCR: 240.0000 mg | EXTENDED_RELEASE_TABLET | Freq: Every day | ORAL | 1 refills | Status: DC

## 2019-03-26 MED ORDER — ATORVASTATIN CALCIUM 40 MG PO TABS: 40.0000 mg | ORAL_TABLET | Freq: Every day | ORAL | 1 refills | Status: DC

## 2019-03-26 NOTE — Telephone Encounter (Signed)
*  STAT* If patient is at the pharmacy, call can be transferred to refill team.   1. Which medications need to be refilled? (please list name of each medication and dose if known) Lipitor and Verapamil  2. Which pharmacy/location (including street and city if local pharmacy) is medication to be sent to? [facility] [place]  3. Do they need a 30 day or 90 day supply? 30

## 2019-03-30 ENCOUNTER — Telehealth: Payer: Self-pay | Admitting: Physician Assistant

## 2019-03-30 NOTE — Telephone Encounter (Signed)
*  STAT* If patient is at the pharmacy, call can be transferred to refill team.   1. Which medications need to be refilled? (please list name of each medication and dose if known) Atorvastatin 40 mg po q d   2. Which pharmacy/location (including street and city if local pharmacy) is medication to be sent to? [facility] [address] [place]  3. Do they need a 30 day or 90 day supply? 30 day   Per patient pharmacy did not receive please resend

## 2019-03-30 NOTE — Telephone Encounter (Signed)
Spoke with Victor[facility] Dike at [facility] who has the Rx refill for atorvastatin and she will get it ready for patient pick up.

## 2019-05-17 ENCOUNTER — Other Ambulatory Visit: Payer: Self-pay | Admitting: Physician Assistant

## 2019-05-21 ENCOUNTER — Telehealth: Payer: Self-pay | Admitting: Physician Assistant

## 2019-05-21 NOTE — Telephone Encounter (Signed)
I spoke with the patient regarding pharmacy feedback/ recommendations. She voices understanding and had no further questions at this time.

## 2019-05-21 NOTE — Telephone Encounter (Signed)
Pt c/o medication issue:  1. Name of Medication: Naproxen and verapamil   2. How are you currently taking this medication (dosage and times per day)?   3. Are you having a reaction (difficulty breathing--STAT)? No   4. What is your medication issue? Patient calling to see what interaction will be .  She was told they interact when going to the pharmacy

## 2019-05-21 NOTE — Telephone Encounter (Signed)
To pharmacy staff to review.

## 2019-05-21 NOTE — Telephone Encounter (Signed)
Naproxen and other NSAIDS can increase blood pressure, therefore can potentially diminish the anti-hypertensive (blood pressure lowering ability) of verapamil.  First recommendation would be to use tylenol. Patient BP appears relatively well controlled. If tylenol is ineffective then she can use naproxen at the lowest effective dose as needed.

## 2019-05-24 DIAGNOSIS — Z6841 Body Mass Index (BMI) 40.0 and over, adult: Secondary | ICD-10-CM

## 2019-05-24 HISTORY — DX: Body Mass Index (BMI) 40.0 and over, adult: Z684

## 2019-06-07 ENCOUNTER — Other Ambulatory Visit: Payer: Self-pay | Admitting: Physician Assistant

## 2019-06-09 ENCOUNTER — Other Ambulatory Visit: Payer: Self-pay | Admitting: Physician Assistant

## 2019-06-11 ENCOUNTER — Telehealth: Payer: Self-pay | Admitting: Physician Assistant

## 2019-06-11 NOTE — Telephone Encounter (Signed)
Medication was refilled 06/07/2019.  Called [facility] to [facility] received the [facility].  Called [facility] and made her aware that the pharmacy was getting her refill together  lisinopril (ZESTRIL) 40 MG tablet 90 tablet 0 06/07/2019    Sig: TAKE 1 TABLET BY MOUTH EVERY DAY   Sent to pharmacy as: lisinopril (ZESTRIL) 40 MG tablet   E-Prescribing Status: Receipt confirmed by pharmacy (06/07/2019  9:43 AM EDT)   Pharmacy  [id number] [place],  - [address]

## 2019-06-11 NOTE — Telephone Encounter (Signed)
*  STAT* If patient is at the pharmacy, call can be transferred to refill team.   1. Which medications need to be refilled? (please list name of each medication and dose if known)  Lisinopril 40 mg po q d   2. Which pharmacy/location (including street and city if local pharmacy) is medication to be sent to?  [facility] [place] [address]   3. Do they need a 30 day or 90 day supply? 90

## 2019-08-07 ENCOUNTER — Ambulatory Visit: Payer: PRIVATE HEALTH INSURANCE | Attending: Internal Medicine

## 2019-08-07 DIAGNOSIS — Z23 Encounter for immunization: Secondary | ICD-10-CM

## 2019-08-07 NOTE — Progress Notes (Signed)
   Covid-19 Vaccination Clinic  Name:  Mindy Myers    MRN: [id number] DOB: [birth year]  08/07/2019  Ms. [name] was observed post Covid-19 immunization for 15 minutes without incident. She was provided with Vaccine Information Sheet and instruction to access the V-Safe system.   Ms. [name] was instructed to call 911 with any severe reactions post vaccine: Marland Kitchen Difficulty breathing  . Swelling of face and throat  . A fast heartbeat  . A bad rash all over body  . Dizziness and weakness   Immunizations Administered    Name Date Dose VIS Date Route   Pfizer COVID-19 Vaccine 08/07/2019  1:38 PM 0.3 mL 05/16/2018 Intramuscular   Manufacturer: [employer], [employer]   Lot: [id number]   NDC: [address]-4

## 2019-08-22 NOTE — Progress Notes (Signed)
Cardiology Office Note    Date:  08/29/2019   ID:  Mindy Myers, DOB 11-Dec-1960, MRN [id number]  PCP:  [facility]  Cardiologist:  Lorine Bears, MD  Electrophysiologist:  None   Chief Complaint: Follow up  History of Present Illness:   Mindy Myers is a 59 y.o. female with history of hypertensive heart disease with chronic diastolic CHF, HLD,osteoarthritis affecting the bilateral hands,COVID-19 infection in 08/2018,and obesity who presents for follow up of hypertension.  She was previously evaluated in 2013 for atypical chest pain, ruled out and underwent nuclear stress testing that was without evidence of ischemia. She reported being diagnosed with HTN years ago and had reportedly been managed with 3 antihypertensives. She had been out of these medications for several months 2/2 finances prior to her admission in early 03/2017.[facility] with an initial BP of 202/86. Cardiac enzymes negative, BNP 83, EKG with sinus rhythm with LVH and repolarization abnormalities. Echo 03/28/17 showed EF of 50-55%, no RWMA, Gr1DD, mildly dilated left atrium, PASP could not be estimated. Lexiscan Myoview 03/28/2017 showed a small defect of mild severity present in the apex location felt to be 2/2 breast attenuation, EF 45-54%, felt to be 2/2 hypertensive heart disease. This was a normal study. She was started on Lipitor, Coreg, irbesartan, and Lasix with a discharge BP of 141/48. Since her admission, she has done well and hasbeen keeping a detailed food diary with strict sodium restriction noted. She was diagnosed with COVID-19 in 08/2018 and did not require hospital admission. Following this diagnosis she noted exertional dyspnea with subsequent D-dimer being elevated. CTA of the chest in 09/2018 was negative for PE. Lower extremity ultrasound was negative for DVT bilaterally. CRP and sed rate were normal. Echo in 10/2018 showed an EF of  50-55% (unchanged from 2019), diastolic dysfunction, mildly dilated LA, mild MR. I last saw her in 01/2019, at which time she was doing well from a cardiac perspective. Home BP readings range from the 1 teens to 140s systolic with rare hypotensive readings in the 90s systolic. Her BP was well controlled in the office. Her weight was up to 283 pounds though this was felt to be secondary to stress eating a more sedentary lifestyle in the setting of her recent COVID-19 infection with noted osteoarthritis as well. No changes were made.  She comes in doing well from a cardiac perspective.  She received her second COVID-19 vaccine on 6/8 and in the setting notes some generalized malaise, fatigue, myalgias, and mild dyspnea.  Otherwise, she has felt well.  She has a new job that she is enjoying.  She is able to sit and take a break when she needs to and her shifts are only 8 hours in duration.  She is following with orthopedics for now right-sided knee pain.  BP has been well controlled at home with readings generally no higher than 140s systolic.  She is tolerating her medications without issues.  She continues to eat a low-sodium diet.  She does not have any issues or concerns at this time.   Labs independently reviewed: 09/2018 - direct LDL 98, TC 162, TG 127, HDL 47, LDL 90, potassium 4.4, BUN 15, SCr 0.90, albumin 3.8, AST/ALT normal 12/2017 - HGB 13.0, PLT 391 03/2017 - A1c 5.4, TSH normal, magnesium 1.8  Past Medical History:  Diagnosis Date  . Chronic diastolic CHF (congestive heart failure) (HCC)    a. TTE 1/19: EF  of 50-55%, no RWMA, Gr1DD, mildly dilated left atrium, PASP could not be estimated  . COVID-19 08/2018  . HLD (hyperlipidemia)   . Hypertension   . Hypertensive heart disease    a. TTE 1/19: EF 50-55%, no RWMA, Gr1DD, mildly dilated left atrium, PASP could not be estimated; b. MV 1/19: small defect of mild severity present in the apex location felt to be 2/2 breast attenuation, EF 45-54%  felt to be 2/2 hypertensive heart disease. This was a normal study  . Obesity     Past Surgical History:  Procedure Laterality Date  . HAND SURGERY Left     Current Medications: Current Meds  Medication Sig  . aspirin 81 MG EC tablet TAKE 1 TABLET (81 MG TOTAL) BY MOUTH DAILY. SWALLOW WHOLE  . atorvastatin (LIPITOR) 40 MG tablet TAKE 1 TABLET (40 MG TOTAL) BY MOUTH DAILY AT 6 PM.  . carvedilol (COREG) 6.25 MG tablet Take 1 tablet (6.25 mg total) by mouth 2 (two) times daily.  . hydrochlorothiazide (MICROZIDE) 12.5 MG capsule Take 1 capsule (12.5 mg total) by mouth daily.  Marland Kitchen lisinopril (ZESTRIL) 40 MG tablet TAKE 1 TABLET BY MOUTH EVERY DAY  . verapamil (CALAN-SR) 240 MG CR tablet Take 1 tablet (240 mg total) by mouth daily.    Allergies:   Amoxicillin   Social History   Socioeconomic History  . Marital status: Single    Spouse name: Not on file  . Number of children: 2  . Years of education: 46  . Highest education level: 12th grade  Occupational History  . Not on file  Tobacco Use  . Smoking status: Never Smoker  . Smokeless tobacco: Never Used  Substance and Sexual Activity  . Alcohol use: No    Comment: Occassional  . Drug use: No  . Sexual activity: Never  Other Topics Concern  . Not on file  Social History Narrative  . Not on file   Social Determinants of Health   Financial Resource Strain:   . Difficulty of Paying Living Expenses:   Food Insecurity:   . Worried About Programme researcher, broadcasting/film/video in the Last Year:   . Barista in the Last Year:   Transportation Needs:   . Freight forwarder (Medical):   Marland Kitchen Lack of Transportation (Non-Medical):   Physical Activity:   . Days of Exercise per Week:   . Minutes of Exercise per Session:   Stress:   . Feeling of Stress :   Social Connections:   . Frequency of Communication with Friends and Family:   . Frequency of Social Gatherings with Friends and Family:   . Attends Religious Services:   . Active  Member of Clubs or Organizations:   . Attends Banker Meetings:   Marland Kitchen Marital Status:      Family History:  The patient's family history includes Cancer in her father; Hypertension in her father and mother.  ROS:   Review of Systems  Constitutional: Positive for malaise/fatigue. Negative for chills, diaphoresis, fever and weight loss.  HENT: Negative for congestion.   Eyes: Negative for discharge and redness.  Respiratory: Positive for shortness of breath. Negative for cough, sputum production and wheezing.   Cardiovascular: Negative for chest pain, palpitations, orthopnea, claudication, leg swelling and PND.  Gastrointestinal: Negative for abdominal pain, heartburn, nausea and vomiting.  Musculoskeletal: Positive for joint pain and myalgias. Negative for falls.  Skin: Negative for rash.  Neurological: Negative for dizziness, tingling, tremors, sensory change,  speech change, focal weakness, loss of consciousness and weakness.  Endo/Heme/Allergies: Does not bruise/bleed easily.  Psychiatric/Behavioral: Negative for substance abuse. The patient is not nervous/anxious.   All other systems reviewed and are negative.    EKGs/Labs/Other Studies Reviewed:    Studies reviewed were summarized above. The additional studies were reviewed today:  Lexiscan MPI 03/2017:  There was no ST segment deviation noted during stress.  No T wave inversion was noted during stress.  Defect 1: There is a small defect of mild severity present in the apex location. This is likely due to breast attenuation  The study is normal.  This is a low risk study.  The left ventricular ejection fraction is mildly decreased (45-54%). Mildly reduced EF likely due to hypertensive heart disease __________  2D echo 03/2017: - Left ventricle: The cavity size was mildly dilated. There was  moderate concentric hypertrophy. Systolic function was normal.  The estimated ejection fraction was in the range of  50% to 55%.  Wall motion was normal; there were no regional wall motion  abnormalities. Doppler parameters are consistent with abnormal  left ventricular relaxation (grade 1 diastolic dysfunction).  - Left atrium: The atrium was mildly dilated.  - Pulmonary arteries: Systolic pressure could not be accurately  estimated. __________  2D echo 10/2018: 1. The left ventricle has low normal systolic function, with an ejection fraction of 50-55%. The cavity size was normal. Left ventricular diastolic Doppler parameters are consistent with impaired relaxation. 2. The right ventricle has normal systolic function. The cavity was normal. There is no increase in right ventricular wall thickness. Unable to estimate RVSP. 3. Left atrial size was mildly dilated.   EKG:  EKG is ordered today.  The EKG ordered today demonstrates NSR, 70 bpm, LVH, no acute ST-T changes  Recent Labs: 10/17/2018: ALT 11; BNP 46.7; BUN 15; Creatinine, Ser 0.90; Potassium 4.4; Sodium 142  Recent Lipid Panel    Component Value Date/Time   CHOL 162 10/17/2018 1010   TRIG 127 10/17/2018 1010   HDL 47 10/17/2018 1010   CHOLHDL 3.4 10/17/2018 1010   CHOLHDL 5.0 03/29/2017 0421   VLDL 26 03/29/2017 0421   LDLCALC 90 10/17/2018 1010   LDLDIRECT 98 10/17/2018 1010    PHYSICAL EXAM:    VS:  BP (!) 142/88 (BP Location: Left Arm, Patient Position: Sitting, Cuff Size: Normal)   Pulse 78   Ht 5\' 9"  (1.753 m)   Wt 287 lb 8 oz (130.4 kg)   SpO2 97%   BMI 42.46 kg/m   BMI: Body mass index is 42.46 kg/m.  Physical Exam  Constitutional: She is oriented to person, place, and time. She appears well-developed and well-nourished.  HENT:  Head: Normocephalic and atraumatic.  Eyes: Right eye exhibits no discharge. Left eye exhibits no discharge.  Neck: No JVD present.  Cardiovascular: Normal rate, regular rhythm, S1 normal, S2 normal and normal heart sounds. Exam reveals no distant heart sounds, no friction rub, no  midsystolic click and no opening snap.  No murmur heard. Pulses:      Posterior tibial pulses are 2+ on the right side and 2+ on the left side.  Pulmonary/Chest: Effort normal and breath sounds normal. No respiratory distress. She has no decreased breath sounds. She has no wheezes. She has no rales. She exhibits no tenderness.  Abdominal: Soft. She exhibits no distension. There is no abdominal tenderness.  Musculoskeletal:        General: No edema.     Cervical back:  Normal range of motion.  Neurological: She is alert and oriented to person, place, and time.  Skin: Skin is warm and dry. No cyanosis. Nails show no clubbing.  Psychiatric: She has a normal mood and affect. Her speech is normal and behavior is normal. Judgment and thought content normal.    Wt Readings from Last 3 Encounters:  08/29/19 287 lb 8 oz (130.4 kg)  02/05/19 283 lb 8 oz (128.6 kg)  01/06/19 270 lb (122.5 kg)     ASSESSMENT & PLAN:   1. Hypertensive heart disease: Blood pressure is mildly elevated today at triage 142/88.  This is likely in the setting of her discomfort following her second COVID-19 vaccine.  Overall, BP has been well controlled at home.  Continue current medications including carvedilol, HCTZ, lisinopril, and verapamil.  Check BMP today.  Continue low-sodium diet.  2. Diastolic dysfunction: She is euvolemic and well compensated.  Remains on HCTZ as outlined above.  Her weight is slightly up when compared to her last visit though this is likely in the setting of a more sedentary lifestyle with her knee pain.  Low-sodium diet recommended.  3. HLD: LDL of 90 from 09/2018.  Remains on atorvastatin 40 mg daily.  4. Morbid obesity: Weight loss is recommended.   Disposition: F/u with me in 6 months, sooner if needed.   Medication Adjustments/Labs and Tests Ordered: Current medicines are reviewed at length with the patient today.  Concerns regarding medicines are outlined above. Medication changes,  Labs and Tests ordered today are summarized above and listed in the Patient Instructions accessible in Encounters.   Signed, Christell Faith, PA-C 08/29/2019 1:33 PM     [address] [place], [place] [address] [phone]

## 2019-08-23 ENCOUNTER — Ambulatory Visit: Payer: PRIVATE HEALTH INSURANCE | Admitting: Cardiovascular Disease

## 2019-08-24 ENCOUNTER — Other Ambulatory Visit: Payer: Self-pay | Admitting: Physician Assistant

## 2019-08-28 ENCOUNTER — Ambulatory Visit: Payer: PRIVATE HEALTH INSURANCE | Admitting: Physician Assistant

## 2019-08-28 ENCOUNTER — Ambulatory Visit: Payer: PRIVATE HEALTH INSURANCE | Admitting: Family

## 2019-08-28 ENCOUNTER — Ambulatory Visit: Payer: PRIVATE HEALTH INSURANCE | Attending: Internal Medicine

## 2019-08-28 DIAGNOSIS — Z23 Encounter for immunization: Secondary | ICD-10-CM

## 2019-08-28 NOTE — Progress Notes (Signed)
   Covid-19 Vaccination Clinic  Name:  Mindy Myers    MRN: [id number] DOB: 26-Jul-1960  08/28/2019  [name] was observed post Covid-19 immunization for 30 minutes based on pre-vaccination screening without incident. She was provided with Vaccine Information Sheet and instruction to access the V-Safe system.   [name] was instructed to call 911 with any severe reactions post vaccine: Marland Kitchen Difficulty breathing  . Swelling of face and throat  . A fast heartbeat  . A bad rash all over body  . Dizziness and weakness   Immunizations Administered    Name Date Dose VIS Date Route   Pfizer COVID-19 Vaccine 08/28/2019 12:03 PM 0.3 mL 05/16/2018 Intramuscular   Manufacturer: [employer], [employer]   Lot: [id number]   NDC: [address]-7

## 2019-08-29 ENCOUNTER — Ambulatory Visit: Payer: BLUE CROSS/BLUE SHIELD | Admitting: Physician Assistant

## 2019-08-29 ENCOUNTER — Encounter: Payer: Self-pay | Admitting: Physician Assistant

## 2019-08-29 ENCOUNTER — Other Ambulatory Visit: Payer: Self-pay

## 2019-08-29 VITALS — BP 142/88 | HR 78 | Ht 69.0 in | Wt 287.5 lb

## 2019-08-29 DIAGNOSIS — I119 Hypertensive heart disease without heart failure: Secondary | ICD-10-CM

## 2019-08-29 DIAGNOSIS — I5189 Other ill-defined heart diseases: Secondary | ICD-10-CM | POA: Diagnosis not present

## 2019-08-29 MED ORDER — ASPIRIN 81 MG PO TBEC: DELAYED_RELEASE_TABLET | ORAL | 4 refills | Status: DC

## 2019-08-29 MED ORDER — HYDROCHLOROTHIAZIDE 12.5 MG PO CAPS: 12.5000 mg | ORAL_CAPSULE | Freq: Every day | ORAL | 12 refills | Status: DC

## 2019-08-29 MED ORDER — VERAPAMIL HCL ER 240 MG PO TBCR: 240.0000 mg | EXTENDED_RELEASE_TABLET | Freq: Every day | ORAL | 12 refills | Status: DC

## 2019-08-29 MED ORDER — ATORVASTATIN CALCIUM 40 MG PO TABS: 40.0000 mg | ORAL_TABLET | Freq: Every day | ORAL | 12 refills | Status: DC

## 2019-08-29 MED ORDER — CARVEDILOL 6.25 MG PO TABS: 6.2500 mg | ORAL_TABLET | Freq: Two times a day (BID) | ORAL | 12 refills | Status: DC

## 2019-08-29 MED ORDER — LISINOPRIL 40 MG PO TABS: 40.0000 mg | ORAL_TABLET | Freq: Every day | ORAL | 12 refills | Status: DC

## 2019-08-29 NOTE — Patient Instructions (Signed)
Medication Instructions:  Your physician recommends that you continue on your current medications as directed. Please refer to the Current Medication list given to you today.  *If you need a refill on your cardiac medications before your next appointment, please call your pharmacy*   Lab Work: BMET today If you have labs (blood work) drawn today and your tests are completely normal, you will receive your results only by: Marland Kitchen MyChart Message (if you have MyChart) OR . A paper copy in the mail If you have any lab test that is abnormal or we need to change your treatment, we will call you to review the results.   Testing/Procedures: None.   Follow-Up: At [facility], you and your health needs are our priority.  As part of our continuing mission to provide you with exceptional heart care, we have created designated Provider Care Teams.  These Care Teams include your primary Cardiologist (physician) and Advanced Practice Providers (APPs -  Physician Assistants and Nurse Practitioners) who all work together to provide you with the care you need, when you need it.  We recommend signing up for the patient portal called "MyChart".  Sign up information is provided on this After Visit Summary.  MyChart is used to connect with patients for Virtual Visits (Telemedicine).  Patients are able to view lab/test results, encounter notes, upcoming appointments, etc.  Non-urgent messages can be sent to your provider as well.   To learn more about what you can do with MyChart, go to [email or web address].    Your next appointment:   6 month(s)  The format for your next appointment:   In Person  Provider:   Eula Listen, PA-C   Other Instructions N/A

## 2019-08-30 LAB — BASIC METABOLIC PANEL
BUN/Creatinine Ratio: 17 (ref 12–28)
BUN: 16 mg/dL (ref 8–27)
CO2: 23 mmol/L (ref 20–29)
Calcium: 8.8 mg/dL (ref 8.7–10.3)
Chloride: 102 mmol/L (ref 96–106)
Creatinine, Ser: 0.95 mg/dL (ref 0.57–1.00)
GFR calc Af Amer: 75 mL/min/{1.73_m2} (ref >59)
GFR calc non Af Amer: 65 mL/min/{1.73_m2} (ref >59)
Glucose: 86 mg/dL (ref 65–99)
Potassium: 4.1 mmol/L (ref 3.5–5.2)
Sodium: 142 mmol/L (ref 134–144)

## 2019-11-30 ENCOUNTER — Other Ambulatory Visit: Payer: Self-pay | Admitting: Physician Assistant

## 2019-11-30 NOTE — Telephone Encounter (Signed)
*  STAT* If patient is at the pharmacy, call can be transferred to refill team.   1. Which medications need to be refilled? (please list name of each medication and dose if known) hydrochlorothiazide 12.5 mg  2. Which pharmacy/location (including street and city if local pharmacy) [facility] [place]  3. Do they need a 30 day or 90 day supply? 90  Patient is completely  out of medication

## 2020-02-28 NOTE — Progress Notes (Signed)
Cardiology Office Note    Date:  02/29/2020   ID:  Mindy Myers, DOB 11-12-1960, MRN [id number]  PCP:  [facility]  Cardiologist:  Lorine Bears, MD  Electrophysiologist:  None   Chief Complaint: Follow up  History of Present Illness:   Mindy Myers is a 59 y.o. female with history of hypertensive heart disease with chronic diastolic CHF, HLD,osteoarthritis affecting the bilateral hands,COVID-19 infection in 08/2018,and obesity who presents for follow up of hypertension.  She was previously evaluated in 2013 for atypical chest pain, ruled out and underwent nuclear stress testing that was without evidence of ischemia. She reported being diagnosed with HTN years ago and had reportedly been managed with 3 antihypertensives. She had been out of these medications for several months 2/2 finances prior to her admission in early 03/2017.[facility] HTN with an initial BP of 202/86. Cardiac enzymes negative, BNP 83, EKG with sinus rhythm with LVH and repolarization abnormalities. Echo 03/28/17 showed EF of 50-55%, no RWMA, Gr1DD, mildly dilated left atrium, PASP could not be estimated. Lexiscan Myoview 03/28/2017 showed a small defect of mild severity present in the apex location felt to be 2/2 breast attenuation, EF 45-54%, felt to be 2/2 hypertensive heart disease. This was a normal study. She was started on Lipitor, Coreg, irbesartan, and Lasix with a discharge BP of 141/48. Since her admission, she has done well and hasbeen keeping a detailed food diary with strict sodium restriction noted. She was diagnosed with COVID-19 in 08/2018 and did not require hospital admission. Following this diagnosis she noted exertional dyspnea with subsequent D-dimer being elevated. CTA of the chest in 09/2018 was negative for PE. Lower extremity ultrasound was negative for DVT bilaterally. CRP and sed rate were normal. Echo in 10/2018 showed an EF of  50-55% (unchanged from 2019), diastolic dysfunction, mildly dilated LA, mild MR. She was seen in 01/2019, at which time she was doing well from a cardiac perspective. Home BP readings range from the 1 teens to 140s systolic with rare hypotensive readings in the 90s systolic. Her BP was well controlled in the office. Her weight was up to 283 pounds though this was felt to be secondary to stress eating a more sedentary lifestyle in the setting of her recent COVID-19 infection with noted osteoarthritis as well.  I last saw her on 08/29/2019 at which time she was doing well from a cardiac perspective.  Things seemed to be improving with her new job.  BP remained reasonably controlled at home with readings generally less than 140 systolic.  No changes were made.  She comes in doing well from a cardiac perspective.  Her blood pressure has remained well controlled.  She is tolerating all medications without issues.  She continues to enjoy her new job with decreased stress.  She did briefly have an increase in her weight at home trending into the 290s.  When she started her food diary again in this setting she realized that she was eating a significant amount of carbs and snacking while at work.  Since cutting these out her weight has improved and is now back to baseline.  She is drinking less than 2 L of fluid daily and watching her salt intake.  She denies any lower extremity swelling, abdominal distention, orthopnea, PND, early satiety, chest pain, dyspnea, palpitations, dizziness, presyncope, or syncope.  She will be traveling in February by train to visit her new grandchildren in [place].  She is not interested in revisiting potential sleep study.  She does not have any issues or concerns at this time.   Labs independently reviewed: 08/2019 - BUN 16, serum creatinine 0.95, potassium 4.1 09/2018 - direct LDL 98, TC 162, TG 127, HDL 47, LDL 90, albumin 3.8, AST/ALT normal 12/2017 - HGB 13.0, PLT 391 03/2017 - A1c  5.4, TSH normal, magnesium 1.8  Past Medical History:  Diagnosis Date  . Chronic diastolic CHF (congestive heart failure) (HCC)    a. TTE 1/19: EF of 50-55%, no RWMA, Gr1DD, mildly dilated left atrium, PASP could not be estimated  . COVID-19 08/2018  . HLD (hyperlipidemia)   . Hypertension   . Hypertensive heart disease    a. TTE 1/19: EF 50-55%, no RWMA, Gr1DD, mildly dilated left atrium, PASP could not be estimated; b. MV 1/19: small defect of mild severity present in the apex location felt to be 2/2 breast attenuation, EF 45-54% felt to be 2/2 hypertensive heart disease. This was a normal study  . Obesity     Past Surgical History:  Procedure Laterality Date  . HAND SURGERY Left     Current Medications: Current Meds  Medication Sig  . aspirin 81 MG EC tablet TAKE 1 TABLET (81 MG TOTAL) BY MOUTH DAILY. SWALLOW WHOLE  . atorvastatin (LIPITOR) 40 MG tablet Take 1 tablet (40 mg total) by mouth daily at 6 PM.  . carvedilol (COREG) 6.25 MG tablet Take 1 tablet (6.25 mg total) by mouth 2 (two) times daily.  . hydrochlorothiazide (MICROZIDE) 12.5 MG capsule TAKE 1 CAPSULE BY MOUTH EVERY DAY  . lisinopril (ZESTRIL) 40 MG tablet Take 1 tablet (40 mg total) by mouth daily.  . verapamil (CALAN-SR) 240 MG CR tablet Take 1 tablet (240 mg total) by mouth daily.    Allergies:   Amoxicillin   Social History   Socioeconomic History  . Marital status: Single    Spouse name: Not on file  . Number of children: 2  . Years of education: 53  . Highest education level: 12th grade  Occupational History  . Not on file  Tobacco Use  . Smoking status: Never Smoker  . Smokeless tobacco: Never Used  Vaping Use  . Vaping Use: Never used  Substance and Sexual Activity  . Alcohol use: No    Comment: Occassional  . Drug use: No  . Sexual activity: Never  Other Topics Concern  . Not on file  Social History Narrative  . Not on file   Social Determinants of Health   Financial Resource Strain:  Not on file  Food Insecurity: Not on file  Transportation Needs: Not on file  Physical Activity: Not on file  Stress: Not on file  Social Connections: Not on file     Family History:  The patient's family history includes Cancer in her father; Hypertension in her father and mother.  ROS:   Review of Systems  Constitutional: Negative for chills, diaphoresis, fever, malaise/fatigue and weight loss.  HENT: Negative for congestion.   Eyes: Negative for discharge and redness.  Respiratory: Negative for cough, sputum production, shortness of breath and wheezing.   Cardiovascular: Negative for chest pain, palpitations, orthopnea, claudication, leg swelling and PND.  Gastrointestinal: Negative for abdominal pain, heartburn, nausea and vomiting.  Musculoskeletal: Negative for falls and myalgias.  Skin: Negative for rash.  Neurological: Negative for dizziness, tingling, tremors, sensory change, speech change, focal weakness, loss of consciousness and weakness.  Endo/Heme/Allergies: Does not bruise/bleed easily.  Psychiatric/Behavioral: Negative for substance abuse. The patient is not nervous/anxious.   All other systems reviewed and are negative.    EKGs/Labs/Other Studies Reviewed:    Studies reviewed were summarized above. The additional studies were reviewed today:  Lexiscan MPI 03/2017:  There was no ST segment deviation noted during stress.  No T wave inversion was noted during stress.  Defect 1: There is a small defect of mild severity present in the apex location. This is likely due to breast attenuation  The study is normal.  This is a low risk study.  The left ventricular ejection fraction is mildly decreased (45-54%). Mildly reduced EF likely due to hypertensive heart disease __________  2D echo 03/2017: - Left ventricle: The cavity size was mildly dilated. There was  moderate concentric hypertrophy. Systolic function was normal.  The estimated ejection fraction  was in the range of 50% to 55%.  Wall motion was normal; there were no regional wall motion  abnormalities. Doppler parameters are consistent with abnormal  left ventricular relaxation (grade 1 diastolic dysfunction).  - Left atrium: The atrium was mildly dilated.  - Pulmonary arteries: Systolic pressure could not be accurately  estimated. __________  2D echo 10/2018: 1. The left ventricle has low normal systolic function, with an ejection fraction of 50-55%. The cavity size was normal. Left ventricular diastolic Doppler parameters are consistent with impaired relaxation. 2. The right ventricle has normal systolic function. The cavity was normal. There is no increase in right ventricular wall thickness. Unable to estimate RVSP. 3. Left atrial size was mildly dilated.   EKG:  EKG is ordered today.  The EKG ordered today demonstrates NSR, 73 bpm, first-degree AV block, LVH, no acute ST-T changes  Recent Labs: 08/29/2019: BUN 16; Creatinine, Ser 0.95; Potassium 4.1; Sodium 142  Recent Lipid Panel    Component Value Date/Time   CHOL 162 10/17/2018 1010   TRIG 127 10/17/2018 1010   HDL 47 10/17/2018 1010   CHOLHDL 3.4 10/17/2018 1010   CHOLHDL 5.0 03/29/2017 0421   VLDL 26 03/29/2017 0421   LDLCALC 90 10/17/2018 1010   LDLDIRECT 98 10/17/2018 1010    PHYSICAL EXAM:    VS:  BP 130/82 (BP Location: Left Arm, Patient Position: Sitting, Cuff Size: Large)   Pulse 73   Ht 5\' 9"  (1.753 m)   Wt 287 lb (130.2 kg)   SpO2 97%   BMI 42.38 kg/m   BMI: Body mass index is 42.38 kg/m.  Physical Exam Vitals reviewed.  Constitutional:      Appearance: She is well-developed and well-nourished.  HENT:     Head: Normocephalic and atraumatic.  Eyes:     General:        Right eye: No discharge.        Left eye: No discharge.  Neck:     Vascular: No JVD.  Cardiovascular:     Rate and Rhythm: Normal rate and regular rhythm.     Pulses: No midsystolic click and no opening snap.           Posterior tibial pulses are 2+ on the right side and 2+ on the left side.     Heart sounds: Normal heart sounds, S1 normal and S2 normal. Heart sounds not distant. No murmur heard. No friction rub.  Pulmonary:     Effort: Pulmonary effort is normal. No respiratory distress.     Breath sounds: Normal breath sounds. No decreased breath sounds, wheezing or rales.  Chest:  Chest wall: No tenderness.  Abdominal:     General: There is no distension.     Palpations: Abdomen is soft.     Tenderness: There is no abdominal tenderness.  Musculoskeletal:        General: No edema.     Cervical back: Normal range of motion.  Skin:    General: Skin is warm and dry.     Nails: There is no clubbing or cyanosis.  Neurological:     Mental Status: She is alert and oriented to person, place, and time.  Psychiatric:        Mood and Affect: Mood and affect normal.        Speech: Speech normal.        Behavior: Behavior normal.        Thought Content: Thought content normal.        Judgment: Judgment normal.     Wt Readings from Last 3 Encounters:  02/29/20 287 lb (130.2 kg)  08/29/19 287 lb 8 oz (130.4 kg)  02/05/19 283 lb 8 oz (128.6 kg)     ASSESSMENT & PLAN:   1. Hypertensive heart disease: Blood pressure is well controlled in the office today.  Continue current medications including carvedilol, HCTZ, lisinopril, and verapamil.  Check BMP.  Low-sodium diet recommended  2. Diastolic dysfunction: She is euvolemic and well compensated.  She remains on HCTZ as outlined above.  Weight is stable.  Continued optimal blood pressure control recommended.  3. HLD: LDL of 90 from 09/2018.  Remains on atorvastatin 40 mg daily  4. Morbid obesity: Weight loss and heart healthy diet is recommended.  She defers a sleep study at this time.  Disposition: F/u with me in 6 months, sooner if needed.   Medication Adjustments/Labs and Tests Ordered: Current medicines are reviewed at length with the  patient today.  Concerns regarding medicines are outlined above. Medication changes, Labs and Tests ordered today are summarized above and listed in the Patient Instructions accessible in Encounters.   Signed, Eula Listenyan Chrystopher Stangl, PA-C 02/29/2020 1:34 PM     [facility] [place] [address] [place], [place] [address] [phone]

## 2020-02-29 ENCOUNTER — Encounter: Payer: Self-pay | Admitting: Physician Assistant

## 2020-02-29 ENCOUNTER — Other Ambulatory Visit: Payer: Self-pay

## 2020-02-29 ENCOUNTER — Ambulatory Visit (INDEPENDENT_AMBULATORY_CARE_PROVIDER_SITE_OTHER): Payer: BLUE CROSS/BLUE SHIELD | Admitting: Physician Assistant

## 2020-02-29 VITALS — BP 130/82 | HR 73 | Ht 69.0 in | Wt 287.0 lb

## 2020-02-29 DIAGNOSIS — I110 Hypertensive heart disease with heart failure: Secondary | ICD-10-CM

## 2020-02-29 DIAGNOSIS — E785 Hyperlipidemia, unspecified: Secondary | ICD-10-CM | POA: Diagnosis not present

## 2020-02-29 DIAGNOSIS — I10 Essential (primary) hypertension: Secondary | ICD-10-CM | POA: Diagnosis not present

## 2020-02-29 DIAGNOSIS — I5032 Chronic diastolic (congestive) heart failure: Secondary | ICD-10-CM | POA: Diagnosis not present

## 2020-02-29 NOTE — Patient Instructions (Signed)
Medication Instructions:  Your physician recommends that you continue on your current medications as directed. Please refer to the Current Medication list given to you today.  *If you need a refill on your cardiac medications before your next appointment, please call your pharmacy*   Lab Work:  Your physician recommends that you have lab work TODAY:  Bmet  If you have labs (blood work) drawn today and your tests are completely normal, you will receive your results only by: Marland Kitchen MyChart Message (if you have MyChart) OR . A paper copy in the mail If you have any lab test that is abnormal or we need to change your treatment, we will call you to review the results.   Testing/Procedures: None ordered   Follow-Up: At [facility], you and your health needs are our priority.  As part of our continuing mission to provide you with exceptional heart care, we have created designated Provider Care Teams.  These Care Teams include your primary Cardiologist (physician) and Advanced Practice Providers (APPs -  Physician Assistants and Nurse Practitioners) who all work together to provide you with the care you need, when you need it.  We recommend signing up for the patient portal called "MyChart".  Sign up information is provided on this After Visit Summary.  MyChart is used to connect with patients for Virtual Visits (Telemedicine).  Patients are able to view lab/test results, encounter notes, upcoming appointments, etc.  Non-urgent messages can be sent to your provider as well.   To learn more about what you can do with MyChart, go to [email or web address].    Your next appointment:   6 month(s)  The format for your next appointment:   In Person  Provider:   Eula Listen, PA-C

## 2020-03-01 LAB — BASIC METABOLIC PANEL
BUN/Creatinine Ratio: 17 (ref 12–28)
BUN: 15 mg/dL (ref 8–27)
CO2: 23 mmol/L (ref 20–29)
Calcium: 8.9 mg/dL (ref 8.7–10.3)
Chloride: 101 mmol/L (ref 96–106)
Creatinine, Ser: 0.86 mg/dL (ref 0.57–1.00)
GFR calc Af Amer: 85 mL/min/{1.73_m2} (ref >59)
GFR calc non Af Amer: 74 mL/min/{1.73_m2} (ref >59)
Sodium: 141 mmol/L (ref 134–144)

## 2020-03-03 ENCOUNTER — Telehealth: Payer: Self-pay | Admitting: *Deleted

## 2020-03-03 DIAGNOSIS — I5032 Chronic diastolic (congestive) heart failure: Secondary | ICD-10-CM

## 2020-03-03 NOTE — Telephone Encounter (Signed)
-----   Message from Sondra Barges, PA-C sent at 03/03/2020  7:12 AM EST ----- Renal function is normal. Potassium not checked due to cells being intact with serum. Please have her come by [facility] at her convenience for a potassium only. This should be at no charge.

## 2020-03-04 NOTE — Telephone Encounter (Signed)
Patient is returning your call.  

## 2020-03-04 NOTE — Telephone Encounter (Signed)
Patient returning call.

## 2020-03-04 NOTE — Telephone Encounter (Signed)
Results called to pt. Pt verbalized understanding. Patient understood the need to get the potassium repeated.  She can come tomorrow to the office to have drawn at 1 pm.  Routing to [place] to help with the no charge.

## 2020-03-04 NOTE — Telephone Encounter (Signed)
No answer. Left message to call back.   

## 2020-03-05 ENCOUNTER — Other Ambulatory Visit: Payer: BLUE CROSS/BLUE SHIELD

## 2020-03-05 ENCOUNTER — Other Ambulatory Visit: Payer: Self-pay

## 2020-03-05 DIAGNOSIS — I5032 Chronic diastolic (congestive) heart failure: Secondary | ICD-10-CM

## 2020-03-06 LAB — POTASSIUM: Potassium: 4.3 mmol/L (ref 3.5–5.2)

## 2020-08-23 NOTE — Progress Notes (Signed)
Cardiology Office Note    Date:  08/27/2020   ID:  Mindy Myers, DOB 05-10-1960, MRN [id number]  [facility]  Cardiologist:  Lorine BearsMuhammad Arida, MD  Electrophysiologist:  None   Chief Complaint: Follow-up  History of Present Illness:   Mindy Myers is a 60 y.o. female with history of hypertensive heart disease with chronic diastolic CHF, HLD,osteoarthritis affecting the bilateral hands,COVID-19 infection in 08/2018,and obesity who presents for follow up of hypertension.  She was previously evaluated in 2013 for atypical chest pain, ruled out and underwent nuclear stress testing that was without evidence of ischemia. She reported being diagnosed with HTN years ago and had reportedly been managed with 3 antihypertensives. She had been out of these medications for several months 2/2 finances prior to her admission in early 03/2017.[id number] with an initial BP of 202/86. Cardiac enzymes negative, BNP 83, EKG with sinus rhythm with LVH and repolarization abnormalities. Echo 03/28/17 showed EF of 50-55%, no RWMA, Gr1DD, mildly dilated left atrium, PASP could not be estimated. Lexiscan Myoview 03/28/2017 showed a small defect of mild severity present in the apex location felt to be 2/2 breast attenuation, EF 45-54%, felt to be 2/2 hypertensive heart disease. This was a normal study. She was started on Lipitor, Coreg, irbesartan, and Lasix with a discharge BP of 141/48. Since her admission, she has done wellandhasbeen keeping a detailed food diary with strict sodium restriction noted.She was diagnosed with COVID-19 in 08/2018 and did not require hospital admission. Following this diagnosis she noted exertional dyspnea with subsequent D-dimer being elevated. CTA of the chest in 09/2018 was negative for PE. Lower extremity ultrasound was negative for DVT bilaterally. CRP and sed rate were normal. Echo in 10/2018 showedan EF of  50-55%, diastolic dysfunction, mildly dilated LA, and mild MR.She was seen in 01/2019, at which time she was doing well from a cardiac perspective. Home BP readings range from the 1 teens to 140s systolic with rare hypotensive readings in the 90s systolic. Her BP was well controlled in the office. Her weight was up to 283 pounds though this was felt to be secondary to stress eating a more sedentary lifestyle in the setting of her recent COVID-19 infection with noted osteoarthritis as well.  She was last seen in the office in 02/2020 and continued to do well from a cardiac perspective.  Her blood pressure remains well controlled.  She was enjoying her new job with decreased stress.  She did note a slight uptick in her weight though upon reviewing her food diary she realized that she had been eating a significant amount of carbs and snacking while at work.  With improvement in diet her weight had returned back to baseline.  She was not interested in revisiting sleep study.  No changes were made.  She comes in today continuing to do very well from a cardiac perspective.  No chest pain, dyspnea, palpitations, presyncope, or syncope.  At times, she has felt dehydrated and with this she has noted some low blood pressures with the lowest reading being in the 70s over 50s at that time late 07/2020.  She typically notes her blood pressure is lower on days that are quite warm.  Most BP readings have been in the low 100s to 1 teens systolic.  She notes that she does not drink much water though is concerned about fluid overload.  She does continue to keep a very detailed  food diary and is limiting her sodium.  No lower extremity swelling or orthopnea.   Labs independently reviewed: 02/2020 - potassium 4.3, BUN 15, serum creatinine 0.86 09/2018 - direct LDL 98, TC 162, TG 127, HDL 47, LDL 90, albumin 3.8, AST/ALT normal 12/2017 - HGB 13.0, PLT 391 03/2017 - A1c 5.4, TSH normal, magnesium 1.8    Past Medical History:   Diagnosis Date  . Chronic diastolic CHF (congestive heart failure) (HCC)    a. TTE 1/19: EF of 50-55%, no RWMA, Gr1DD, mildly dilated left atrium, PASP could not be estimated  . COVID-19 08/2018  . HLD (hyperlipidemia)   . Hypertension   . Hypertensive heart disease    a. TTE 1/19: EF 50-55%, no RWMA, Gr1DD, mildly dilated left atrium, PASP could not be estimated; b. MV 1/19: small defect of mild severity present in the apex location felt to be 2/2 breast attenuation, EF 45-54% felt to be 2/2 hypertensive heart disease. This was a normal study  . Obesity     Past Surgical History:  Procedure Laterality Date  . HAND SURGERY Left     Current Medications: Current Meds  Medication Sig  . [DISCONTINUED] aspirin 81 MG EC tablet TAKE 1 TABLET (81 MG TOTAL) BY MOUTH DAILY. SWALLOW WHOLE  . [DISCONTINUED] atorvastatin (LIPITOR) 40 MG tablet Take 1 tablet (40 mg total) by mouth daily at 6 PM.  . [DISCONTINUED] carvedilol (COREG) 6.25 MG tablet Take 1 tablet (6.25 mg total) by mouth 2 (two) times daily.  . [DISCONTINUED] hydrochlorothiazide (MICROZIDE) 12.5 MG capsule TAKE 1 CAPSULE BY MOUTH EVERY DAY  . [DISCONTINUED] lisinopril (ZESTRIL) 40 MG tablet Take 1 tablet (40 mg total) by mouth daily.  . [DISCONTINUED] verapamil (CALAN-SR) 240 MG CR tablet Take 1 tablet (240 mg total) by mouth daily.    Allergies:   Amoxicillin   Social History   Socioeconomic History  . Marital status: Single    Spouse name: Not on file  . Number of children: 2  . Years of education: 24  . Highest education level: 12th grade  Occupational History  . Not on file  Tobacco Use  . Smoking status: Never Smoker  . Smokeless tobacco: Never Used  Vaping Use  . Vaping Use: Never used  Substance and Sexual Activity  . Alcohol use: No    Comment: Occassional  . Drug use: No  . Sexual activity: Never  Other Topics Concern  . Not on file  Social History Narrative  . Not on file   Social Determinants of  Health   Financial Resource Strain: Not on file  Food Insecurity: Not on file  Transportation Needs: Not on file  Physical Activity: Not on file  Stress: Not on file  Social Connections: Not on file     Family History:  The patient's family history includes Cancer in her father; Hypertension in her father and mother.  ROS:   Review of Systems  Constitutional: Negative for chills, diaphoresis, fever, malaise/fatigue and weight loss.  HENT: Negative for congestion.   Eyes: Negative for discharge and redness.  Respiratory: Negative for cough, sputum production, shortness of breath and wheezing.   Cardiovascular: Negative for chest pain, palpitations, orthopnea, claudication, leg swelling and PND.  Gastrointestinal: Negative for abdominal pain, heartburn, nausea and vomiting.  Musculoskeletal: Negative for falls and myalgias.  Skin: Negative for rash.  Neurological: Positive for dizziness. Negative for tingling, tremors, sensory change, speech change, focal weakness, loss of consciousness and weakness.  Dizzy when BP is low  Endo/Heme/Allergies: Does not bruise/bleed easily.  Psychiatric/Behavioral: Negative for substance abuse. The patient is not nervous/anxious.   All other systems reviewed and are negative.    EKGs/Labs/Other Studies Reviewed:    Studies reviewed were summarized above. The additional studies were reviewed today:  Lexiscan MPI 03/2017:  There was no ST segment deviation noted during stress.  No T wave inversion was noted during stress.  Defect 1: There is a small defect of mild severity present in the apex location. This is likely due to breast attenuation  The study is normal.  This is a low risk study.  The left ventricular ejection fraction is mildly decreased (45-54%). Mildly reduced EF likely due to hypertensive heart disease __________  2D echo 03/2017: - Left ventricle: The cavity size was mildly dilated. There was  moderate concentric  hypertrophy. Systolic function was normal.  The estimated ejection fraction was in the range of 50% to 55%.  Wall motion was normal; there were no regional wall motion  abnormalities. Doppler parameters are consistent with abnormal  left ventricular relaxation (grade 1 diastolic dysfunction).  - Left atrium: The atrium was mildly dilated.  - Pulmonary arteries: Systolic pressure could not be accurately  estimated. __________  2D echo 10/2018: 1. The left ventricle has low normal systolic function, with an ejection fraction of 50-55%. The cavity size was normal. Left ventricular diastolic Doppler parameters are consistent with impaired relaxation. 2. The right ventricle has normal systolic function. The cavity was normal. There is no increase in right ventricular wall thickness. Unable to estimate RVSP. 3. Left atrial size was mildly dilated.   EKG:  EKG is ordered today.  The EKG ordered today demonstrates NSR, 66 bpm, LVH, normal axis, no acute ST-T changes  Recent Labs: 02/29/2020: BUN 15; Creatinine, Ser 0.86; Sodium 141 03/05/2020: Potassium 4.3  Recent Lipid Panel    Component Value Date/Time   CHOL 162 10/17/2018 1010   TRIG 127 10/17/2018 1010   HDL 47 10/17/2018 1010   CHOLHDL 3.4 10/17/2018 1010   CHOLHDL 5.0 03/29/2017 0421   VLDL 26 03/29/2017 0421   LDLCALC 90 10/17/2018 1010   LDLDIRECT 98 10/17/2018 1010    PHYSICAL EXAM:    VS:  BP 120/74 (BP Location: Left Arm, Patient Position: Sitting, Cuff Size: Large)   Pulse 66   Ht 5\' 9"  (1.753 m)   Wt 284 lb (128.8 kg)   SpO2 98%   BMI 41.94 kg/m   BMI: Body mass index is 41.94 kg/m.  Physical Exam Vitals reviewed.  Constitutional:      Appearance: She is well-developed.  HENT:     Head: Normocephalic and atraumatic.  Eyes:     General:        Right eye: No discharge.        Left eye: No discharge.  Neck:     Vascular: No JVD.  Cardiovascular:     Rate and Rhythm: Normal rate and regular  rhythm.     Pulses: No midsystolic click and no opening snap.          Posterior tibial pulses are 2+ on the right side and 2+ on the left side.     Heart sounds: Normal heart sounds, S1 normal and S2 normal. Heart sounds not distant. No murmur heard. No friction rub.  Pulmonary:     Effort: Pulmonary effort is normal. No respiratory distress.     Breath sounds: Normal breath sounds. No decreased breath  sounds, wheezing or rales.  Chest:     Chest wall: No tenderness.  Abdominal:     General: There is no distension.     Palpations: Abdomen is soft.     Tenderness: There is no abdominal tenderness.  Musculoskeletal:     Cervical back: Normal range of motion.  Skin:    General: Skin is warm and dry.     Nails: There is no clubbing.  Neurological:     Mental Status: She is alert and oriented to person, place, and time.  Psychiatric:        Speech: Speech normal.        Behavior: Behavior normal.        Thought Content: Thought content normal.        Judgment: Judgment normal.     Wt Readings from Last 3 Encounters:  08/27/20 284 lb (128.8 kg)  02/29/20 287 lb (130.2 kg)  08/29/19 287 lb 8 oz (130.4 kg)     ASSESSMENT & PLAN:   1. Hypertensive heart disease: Blood pressure is well controlled in the office today.  At times, her blood pressure has been on the soft side, this appears to be consistent with dehydration.  Advised her she can increase her fluid intake up to 2 L/day however, like to continue to significantly restrict sodium as she is doing in an effort to minimize risk of volume overload.  She will continue current medical therapy including carvedilol, HCTZ, lisinopril, and verapamil.  We will check a CMP as outlined below.  2. Diastolic dysfunction: She appears euvolemic and well compensated.  Continue optimal blood pressure control on current medical therapy.  3. HLD: LDL 90 in 09/2018.  She remains on atorvastatin 40 mg.  Check CMP, lipid panel, and direct  LDL.  4. Morbid obesity: Weight is down 3 pounds today.  Her last clinic visit.  Continued weight loss through heart healthy diet and regular exercise is recommended.  Disposition: F/u with Dr. Kirke Corin or an APP in 6 months, sooner if needed.   Medication Adjustments/Labs and Tests Ordered: Current medicines are reviewed at length with the patient today.  Concerns regarding medicines are outlined above. Medication changes, Labs and Tests ordered today are summarized above and listed in the Patient Instructions accessible in Encounters.   Signed, Eula Listen, PA-C 08/27/2020 2:24 PM     [facility] [place] [address] [place], [place] [address] [phone]

## 2020-08-27 ENCOUNTER — Encounter: Payer: Self-pay | Admitting: Physician Assistant

## 2020-08-27 ENCOUNTER — Other Ambulatory Visit: Payer: Self-pay

## 2020-08-27 ENCOUNTER — Ambulatory Visit: Payer: BLUE CROSS/BLUE SHIELD | Admitting: Physician Assistant

## 2020-08-27 VITALS — BP 120/74 | HR 66 | Ht 69.0 in | Wt 284.0 lb

## 2020-08-27 DIAGNOSIS — I5032 Chronic diastolic (congestive) heart failure: Secondary | ICD-10-CM

## 2020-08-27 DIAGNOSIS — I10 Essential (primary) hypertension: Secondary | ICD-10-CM

## 2020-08-27 DIAGNOSIS — I110 Hypertensive heart disease with heart failure: Secondary | ICD-10-CM | POA: Diagnosis not present

## 2020-08-27 DIAGNOSIS — E785 Hyperlipidemia, unspecified: Secondary | ICD-10-CM

## 2020-08-27 DIAGNOSIS — Z79899 Other long term (current) drug therapy: Secondary | ICD-10-CM | POA: Diagnosis not present

## 2020-08-27 MED ORDER — ASPIRIN 81 MG PO TBEC: DELAYED_RELEASE_TABLET | ORAL | 3 refills | Status: DC

## 2020-08-27 MED ORDER — ATORVASTATIN CALCIUM 40 MG PO TABS: 40.0000 mg | ORAL_TABLET | Freq: Every day | ORAL | 3 refills | Status: DC

## 2020-08-27 MED ORDER — LISINOPRIL 40 MG PO TABS: 40.0000 mg | ORAL_TABLET | Freq: Every day | ORAL | 3 refills | Status: DC

## 2020-08-27 MED ORDER — VERAPAMIL HCL ER 240 MG PO TBCR: 240.0000 mg | EXTENDED_RELEASE_TABLET | Freq: Every day | ORAL | 3 refills | Status: DC

## 2020-08-27 MED ORDER — CARVEDILOL 6.25 MG PO TABS: 6.2500 mg | ORAL_TABLET | Freq: Two times a day (BID) | ORAL | 3 refills | Status: DC

## 2020-08-27 MED ORDER — HYDROCHLOROTHIAZIDE 12.5 MG PO CAPS: ORAL_CAPSULE | ORAL | 3 refills | Status: DC

## 2020-08-27 NOTE — Patient Instructions (Signed)
Medication Instructions:  Please continue your current medications  *If you need a refill on your cardiac medications before your next appointment, please call your pharmacy*   Lab Work: CMET, Lipid, Direct LDL  LABS WILL APPEAR ON MYCHART, ABNORMAL RESULTS WILL BE CALLED  Testing/Procedures: None   Follow-Up: Your next appointment:   6 month(s)  The format for your next appointment:   In Person  Provider:   Lorine Bears, MD or Eula Listen, PA-C

## 2020-08-28 ENCOUNTER — Telehealth: Payer: Self-pay | Admitting: Physician Assistant

## 2020-08-28 LAB — LDL CHOLESTEROL, DIRECT: LDL Direct: 82 mg/dL (ref 0–99)

## 2020-08-28 LAB — LIPID PANEL
Chol/HDL Ratio: 3.8 ratio (ref 0.0–4.4)
Cholesterol, Total: 146 mg/dL (ref 100–199)
HDL: 38 mg/dL — ABNORMAL LOW (ref >39)
LDL Chol Calc (NIH): 84 mg/dL (ref 0–99)
Triglycerides: 135 mg/dL (ref 0–149)
VLDL Cholesterol Cal: 24 mg/dL (ref 5–40)

## 2020-08-28 LAB — COMPREHENSIVE METABOLIC PANEL
ALT: 10 IU/L (ref 0–32)
AST: 13 IU/L (ref 0–40)
Albumin/Globulin Ratio: 1.8 (ref 1.2–2.2)
Albumin: 4.4 g/dL (ref 3.8–4.8)
Alkaline Phosphatase: 121 IU/L (ref 44–121)
BUN/Creatinine Ratio: 16 (ref 12–28)
BUN: 19 mg/dL (ref 8–27)
Bilirubin Total: 0.5 mg/dL (ref 0.0–1.2)
CO2: 25 mmol/L (ref 20–29)
Calcium: 8.8 mg/dL (ref 8.7–10.3)
Chloride: 99 mmol/L (ref 96–106)
Creatinine, Ser: 1.19 mg/dL — ABNORMAL HIGH (ref 0.57–1.00)
Globulin, Total: 2.5 g/dL (ref 1.5–4.5)
Glucose: 95 mg/dL (ref 65–99)
Potassium: 4.8 mmol/L (ref 3.5–5.2)
Sodium: 141 mmol/L (ref 134–144)
Total Protein: 6.9 g/dL (ref 6.0–8.5)
eGFR: 52 mL/min/{1.73_m2} — ABNORMAL LOW (ref >59)

## 2020-08-28 NOTE — Telephone Encounter (Signed)
Patient calling in for lab results. Saw some flags that concern her on mychart  Please advise

## 2020-08-28 NOTE — Telephone Encounter (Signed)
Called and spoke to pt.  Notified that I have reviewed pt's preliminary labs.  There is no urgent abnormal labs at this time.  Pt questioning creatinine elevation at 1.26.  Notified pt this could be d/t dehydration and once provider has reviewed and given recc we will contact the pt with result note. Pt appreciative and voiced understanding.

## 2020-08-29 ENCOUNTER — Telehealth: Payer: Self-pay | Admitting: *Deleted

## 2020-08-29 NOTE — Telephone Encounter (Signed)
Left voicemail message to call back  

## 2020-08-29 NOTE — Telephone Encounter (Signed)
Patient returning call.

## 2020-08-29 NOTE — Telephone Encounter (Signed)
-----   Message from Sondra Barges, [place] sent at 08/28/2020  3:08 PM EDT ----- Mindy Myers function is mildly elevated Potassium at goal Liver function normal LDL ok at 84  Recommendations: -Increase water intake like we discussed at her visit -Continue current medications

## 2020-08-29 NOTE — Telephone Encounter (Signed)
Left voicemail message to call back on both numbers.

## 2020-09-01 NOTE — Telephone Encounter (Signed)
Spoke with patient and she is currently walking and requested that I call back.

## 2020-09-02 NOTE — Telephone Encounter (Signed)
Reviewed results and recommendations with patient and she verbalized understanding with no further questions at this time.  

## 2021-02-23 NOTE — Progress Notes (Signed)
Cardiology Office Note    Date:  02/27/2021   ID:  Mindy Myers, DOB 01-Mar-1961, MRN [id number]  [facility]  Cardiologist:  Lorine BearsMuhammad Arida, MD  Electrophysiologist:  None   Chief Complaint: Follow-up  History of Present Illness:   Mindy Myers is a 60 y.o. female with history of hypertensive heart disease with chronic diastolic CHF, HLD, osteoarthritis affecting the bilateral hands, COVID-19 infection in 08/2018, and obesity who presents for follow up of hypertension.   She was previously evaluated in 2013 for atypical chest pain, ruled out and underwent nuclear stress testing that was without evidence of ischemia. She reported being diagnosed with HTN years ago and had reportedly been managed with 3 antihypertensives. She had been out of these medications for several months 2/2 finances prior to her admission in early 03/2017. [facility] for chest pain and accelerated HTN with an initial BP of 202/86. Cardiac enzymes negative, BNP 83, EKG with sinus rhythm with LVH and repolarization abnormalities. Echo 03/28/17 showed EF of 50-55%, no RWMA, Gr1DD, mildly dilated left atrium, PASP could not be estimated. Lexiscan Myoview 03/28/2017 showed a small defect of mild severity present in the apex location felt to be 2/2 breast attenuation, EF 45-54%, felt to be 2/2 hypertensive heart disease. This was a normal study. She was started on Lipitor, Coreg, irbesartan, and Lasix with a discharge BP of 141/48. Since her admission, she has done well and has been keeping a detailed food diary with strict sodium restriction noted. She was diagnosed with COVID-19 in 08/2018 and did not require hospital admission. Following this diagnosis she noted exertional dyspnea with subsequent D-dimer being elevated. CTA of the chest in 09/2018 was negative for PE. Lower extremity ultrasound was negative for DVT bilaterally. CRP and sed rate were normal. Echo in 10/2018 showed an EF of  50-55%, diastolic dysfunction, mildly dilated LA, and mild MR. She was seen in 01/2019, at which time she was doing well from a cardiac perspective. Home BP readings range from the 1 teens to 140s systolic with rare hypotensive readings in the 90s systolic. Her BP was well controlled in the office. Her weight was up to 283 pounds though this was felt to be secondary to stress eating a more sedentary lifestyle in the setting of her recent COVID-19 infection with noted osteoarthritis as well.  She was seen in the office in 02/2020 and continued to do well from a cardiac perspective.  Her blood pressure remained well controlled.  She was enjoying her new job with decreased stress.  She did note a slight uptick in her weight though upon reviewing her food diary she realized that she had been eating a significant amount of carbs and snacking while at work.  With improvement in diet her weight had returned back to baseline.  She was not interested in revisiting sleep study.  No changes were made.  She was last seen in the office in 08/2020 and continued to do very well from a cardiac perspective with most BP readings in the low 100s to 1 teens systolic.  She did note an occasional episode of low BP, which she felt like was due to fluid restriction and dehydration.  She was advised to increase her fluid intake up to 2 L/day.  No medication changes were made.  She comes in continuing to do very well from a cardiac perspective.  No chest pain, dyspnea, palpitations, dizziness, presyncope, or syncope.  No lower extremity swelling, abdominal distention, orthopnea, PND, early satiety.  She does continue to watch her sodium strictly.  Blood pressures have been overall well controlled.  She did not bring her BP/food log with her today.  She is getting over a URI and reports that she feels "100% better."  She attributes this to the flu vaccine.  She is tolerating all cardiac medications without issues.   Labs independently  reviewed: 08/2020 - direct LDL 82, TC 146, TG 135, HDL 38, BUN 19, serum creatinine 1.19, potassium 4.8, albumin 4.4, AST/ALT normal 12/2017 - Hgb 13.0, PLT 391 03/2017 - A1c 5.4, TSH normal, magnesium 1.8  Past Medical History:  Diagnosis Date   Chronic diastolic CHF (congestive heart failure) ([place])    a. TTE 1/19: EF of 50-55%, no RWMA, Gr1DD, mildly dilated left atrium, PASP could not be estimated   COVID-19 08/2018   HLD (hyperlipidemia)    Hypertension    Hypertensive heart disease    a. TTE 1/19: EF 50-55%, no RWMA, Gr1DD, mildly dilated left atrium, PASP could not be estimated; b. MV 1/19: small defect of mild severity present in the apex location felt to be 2/2 breast attenuation, EF 45-54% felt to be 2/2 hypertensive heart disease. This was a normal study   Obesity     Past Surgical History:  Procedure Laterality Date   HAND SURGERY Left     Current Medications: Current Meds  Medication Sig   aspirin 81 MG EC tablet TAKE 1 TABLET (81 MG TOTAL) BY MOUTH DAILY. SWALLOW WHOLE   atorvastatin (LIPITOR) 40 MG tablet Take 1 tablet (40 mg total) by mouth daily at 6 PM.   carvedilol (COREG) 6.25 MG tablet Take 1 tablet (6.25 mg total) by mouth 2 (two) times daily.   hydrochlorothiazide (MICROZIDE) 12.5 MG capsule TAKE 1 CAPSULE BY MOUTH EVERY DAY   lisinopril (ZESTRIL) 40 MG tablet Take 1 tablet (40 mg total) by mouth daily.   verapamil (CALAN-SR) 240 MG CR tablet Take 1 tablet (240 mg total) by mouth daily.    Allergies:   Amoxicillin   Social History   Socioeconomic History   Marital status: Single    Spouse name: Not on file   Number of children: 2   Years of education: 12   Highest education level: 12th grade  Occupational History   Not on file  Tobacco Use   Smoking status: Never   Smokeless tobacco: Never  Vaping Use   Vaping Use: Never used  Substance and Sexual Activity   Alcohol use: Yes    Comment: 1 glass of wine every 6 months   Drug use: No   Sexual  activity: Never  Other Topics Concern   Not on file  Social History Narrative   Not on file   Social Determinants of Health   Financial Resource Strain: Not on file  Food Insecurity: Not on file  Transportation Needs: Not on file  Physical Activity: Not on file  Stress: Not on file  Social Connections: Not on file     Family History:  The patient's family history includes Cancer in her father; Hypertension in her father and mother.  ROS:   Review of Systems  Constitutional:  Negative for chills, diaphoresis, fever, malaise/fatigue and weight loss.  HENT:  Positive for congestion.   Eyes:  Negative for discharge and redness.  Respiratory:  Positive for cough and sputum production. Negative for shortness of breath and wheezing.  Yellow sputum, improved  Cardiovascular:  Negative for chest pain, palpitations, orthopnea, claudication, leg swelling and PND.  Gastrointestinal:  Negative for abdominal pain, heartburn, nausea and vomiting.  Musculoskeletal:  Negative for falls and myalgias.  Skin:  Negative for rash.  Neurological:  Negative for dizziness, tingling, tremors, sensory change, speech change, focal weakness, loss of consciousness and weakness.  Endo/Heme/Allergies:  Does not bruise/bleed easily.  Psychiatric/Behavioral:  Negative for substance abuse. The patient is not nervous/anxious.   All other systems reviewed and are negative.   EKGs/Labs/Other Studies Reviewed:    Studies reviewed were summarized above. The additional studies were reviewed today:  Lexiscan MPI 03/2017: There was no ST segment deviation noted during stress. No T wave inversion was noted during stress. Defect 1: There is a small defect of mild severity present in the apex location. This is likely due to breast attenuation The study is normal. This is a low risk study. The left ventricular ejection fraction is mildly decreased (45-54%). Mildly reduced EF likely due to hypertensive heart  disease __________   2D echo 03/2017: - Left ventricle: The cavity size was mildly dilated. There was    moderate concentric hypertrophy. Systolic function was normal.    The estimated ejection fraction was in the range of 50% to 55%.    Wall motion was normal; there were no regional wall motion    abnormalities. Doppler parameters are consistent with abnormal    left ventricular relaxation (grade 1 diastolic dysfunction).  - Left atrium: The atrium was mildly dilated.  - Pulmonary arteries: Systolic pressure could not be accurately    estimated. __________   2D echo 10/2018: 1. The left ventricle has low normal systolic function, with an ejection fraction of 50-55%. The cavity size was normal. Left ventricular diastolic Doppler parameters are consistent with impaired relaxation.  2. The right ventricle has normal systolic function. The cavity was normal. There is no increase in right ventricular wall thickness. Unable to estimate RVSP.  3. Left atrial size was mildly dilated.    EKG:  EKG is ordered today.  The EKG ordered today demonstrates sinus bradycardia, 59 bpm, minimal voltage for LVH, cannot exclude prior anterior MI versus lead placement, no acute ST-T changes  Recent Labs: 08/27/2020: ALT 10; BUN 19; Creatinine, Ser 1.19; Potassium 4.8; Sodium 141  Recent Lipid Panel    Component Value Date/Time   CHOL 146 08/27/2020 1403   TRIG 135 08/27/2020 1403   HDL 38 (L) 08/27/2020 1403   CHOLHDL 3.8 08/27/2020 1403   CHOLHDL 5.0 03/29/2017 0421   VLDL 26 03/29/2017 0421   LDLCALC 84 08/27/2020 1403   LDLDIRECT 82 08/27/2020 1403    PHYSICAL EXAM:    VS:  BP 130/84 (BP Location: Left Arm, Patient Position: Sitting, Cuff Size: Large)   Pulse (!) 59   Ht 5\' 9"  (1.753 m)   Wt 284 lb (128.8 kg)   SpO2 97%   BMI 41.94 kg/m   BMI: Body mass index is 41.94 kg/m.  Physical Exam Vitals reviewed.  Constitutional:      Appearance: She is well-developed.  HENT:     Head:  Normocephalic and atraumatic.  Eyes:     General:        Right eye: No discharge.        Left eye: No discharge.  Neck:     Vascular: No JVD.  Cardiovascular:     Rate and Rhythm: Normal rate and regular rhythm.     Pulses:  Posterior tibial pulses are 2+ on the right side and 2+ on the left side.     Heart sounds: Normal heart sounds, S1 normal and S2 normal. Heart sounds not distant. No midsystolic click and no opening snap. No murmur heard.   No friction rub.  Pulmonary:     Effort: Pulmonary effort is normal. No respiratory distress.     Breath sounds: Normal breath sounds. No decreased breath sounds, wheezing or rales.  Chest:     Chest wall: No tenderness.  Abdominal:     General: There is no distension.     Palpations: Abdomen is soft.     Tenderness: There is no abdominal tenderness.  Musculoskeletal:     Cervical back: Normal range of motion.     Right lower leg: No edema.     Left lower leg: No edema.  Skin:    General: Skin is warm and dry.     Nails: There is no clubbing.  Neurological:     Mental Status: She is alert and oriented to person, place, and time.  Psychiatric:        Speech: Speech normal.        Behavior: Behavior normal.        Thought Content: Thought content normal.        Judgment: Judgment normal.    Wt Readings from Last 3 Encounters:  02/27/21 284 lb (128.8 kg)  08/27/20 284 lb (128.8 kg)  02/29/20 287 lb (130.2 kg)     ASSESSMENT & PLAN:   Hypertensive heart disease: Blood pressure is well controlled in the office today.  She remains on carvedilol, HCTZ, lisinopril, and verapamil.  Check BMP.  Continue low-sodium diet.  Diastolic dysfunction: She is euvolemic and well compensated.  Continue optimal blood pressure control and current medical therapy.  HLD: LDL 82 in 08/2020.  She remains on atorvastatin 40 mg.  Obesity: Weight loss is encouraged through heart healthy diet and regular exercise.  She has declined a sleep  study.   Disposition: F/u with Dr. Fletcher Anon or an APP in 6 months.   Medication Adjustments/Labs and Tests Ordered: Current medicines are reviewed at length with the patient today.  Concerns regarding medicines are outlined above. Medication changes, Labs and Tests ordered today are summarized above and listed in the Patient Instructions accessible in Encounters.   Signed, Christell Faith, PA-C 02/27/2021 2:40 PM     [address], [place] [address] [phone]

## 2021-02-27 ENCOUNTER — Encounter: Payer: Self-pay | Admitting: Physician Assistant

## 2021-02-27 ENCOUNTER — Other Ambulatory Visit
Admission: EM | Admit: 2021-02-27 | Discharge: 2021-02-27 | Disposition: A | Discharge status: Home / Self Care | Payer: BLUE CROSS/BLUE SHIELD | Source: Ambulatory Visit | Attending: Physician Assistant | Admitting: Physician Assistant

## 2021-02-27 ENCOUNTER — Other Ambulatory Visit: Payer: Self-pay

## 2021-02-27 ENCOUNTER — Ambulatory Visit: Payer: BLUE CROSS/BLUE SHIELD | Admitting: Physician Assistant

## 2021-02-27 VITALS — BP 130/84 | HR 59 | Ht 69.0 in | Wt 284.0 lb

## 2021-02-27 DIAGNOSIS — I110 Hypertensive heart disease with heart failure: Secondary | ICD-10-CM | POA: Diagnosis not present

## 2021-02-27 DIAGNOSIS — I5032 Chronic diastolic (congestive) heart failure: Secondary | ICD-10-CM | POA: Insufficient documentation

## 2021-02-27 DIAGNOSIS — E782 Mixed hyperlipidemia: Secondary | ICD-10-CM

## 2021-02-27 DIAGNOSIS — E785 Hyperlipidemia, unspecified: Secondary | ICD-10-CM | POA: Diagnosis not present

## 2021-02-27 DIAGNOSIS — Z6841 Body Mass Index (BMI) 40.0 and over, adult: Secondary | ICD-10-CM

## 2021-02-27 LAB — BASIC METABOLIC PANEL
Anion gap: 6 (ref 5–15)
BUN: 18 mg/dL (ref 8–23)
CO2: 28 mmol/L (ref 22–32)
Calcium: 8.7 mg/dL — ABNORMAL LOW (ref 8.9–10.3)
Chloride: 103 mmol/L (ref 98–111)
Creatinine, Ser: 0.96 mg/dL (ref 0.44–1.00)
GFR, Estimated: >60 mL/min (ref >60)
Glucose, Bld: 96 mg/dL (ref 70–99)
Potassium: 4 mmol/L (ref 3.5–5.1)
Sodium: 137 mmol/L (ref 135–145)

## 2021-02-27 NOTE — Patient Instructions (Addendum)
Medication Instructions:  - Your physician recommends that you continue on your current medications as directed. Please refer to the Current Medication list given to you today.  *If you need a refill on your cardiac medications before your next appointment, please call your pharmacy*   Lab Work: - Your physician recommends that you have lab work today: [employer]  Medical Mall Entrance at [facility] 1st desk on the right to check in (REGISTRATION) Lab hours: Monday- Friday (7:30 am- 5:30 pm)   If you have labs (blood work) drawn today and your tests are completely normal, you will receive your results only by: MyChart Message (if you have MyChart) OR A paper copy in the mail If you have any lab test that is abnormal or we need to change your treatment, we will call you to review the results.   Testing/Procedures: - none ordered   Follow-Up: At [facility], you and your health needs are our priority.  As part of our continuing mission to provide you with exceptional heart care, we have created designated Provider Care Teams.  These Care Teams include your primary Cardiologist (physician) and Advanced Practice Providers (APPs -  Physician Assistants and Nurse Practitioners) who all work together to provide you with the care you need, when you need it.  We recommend signing up for the patient portal called "MyChart".  Sign up information is provided on this After Visit Summary.  MyChart is used to connect with patients for Virtual Visits (Telemedicine).  Patients are able to view lab/test results, encounter notes, upcoming appointments, etc.  Non-urgent messages can be sent to your provider as well.   To learn more about what you can do with MyChart, go to [email or web address].    Your next appointment:   6 month(s)  The format for your next appointment:   In Person  Provider:   You may see Lorine Bears, MDor one of the following Advanced Practice Providers on your designated Care Team:     Eula Listen, PA-C    Other Instructions N/a

## 2021-04-19 IMAGING — CR DG KNEE COMPLETE 4+V*L*
5 series · 5 of 5 positions shown · non-contrast
Comparison: None.

CLINICAL DATA: Left knee pain since a slip and fall on steps 2 days
ago. Initial encounter.

EXAM:
LEFT KNEE - COMPLETE 4+ VIEW

[knee ap]
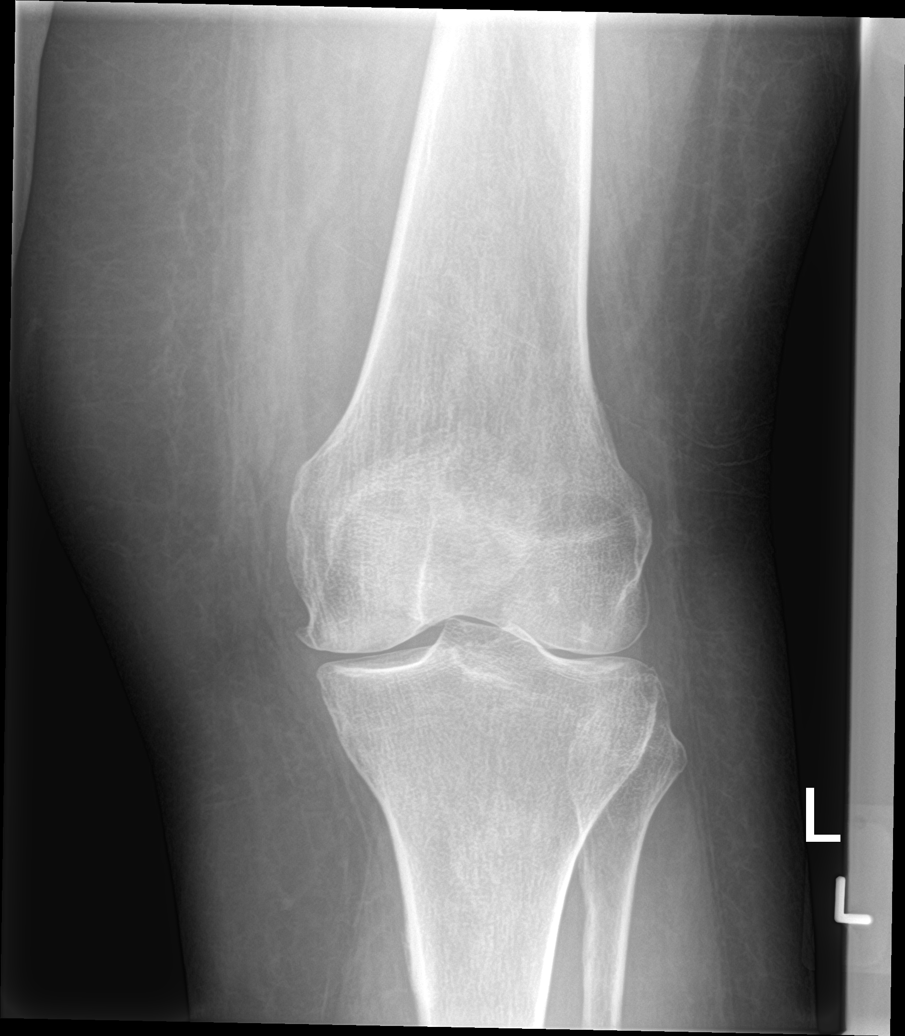

[knee obl (1 of 2)]
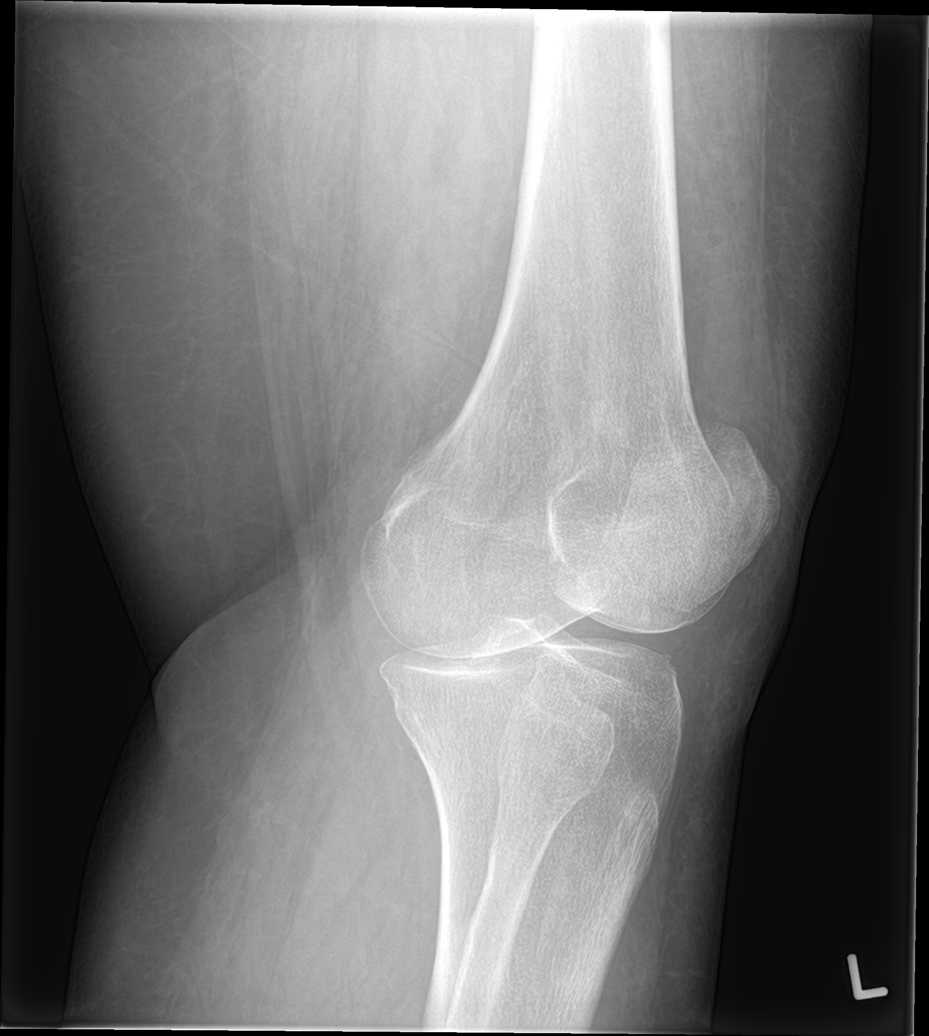

[knee obl (2 of 2)]
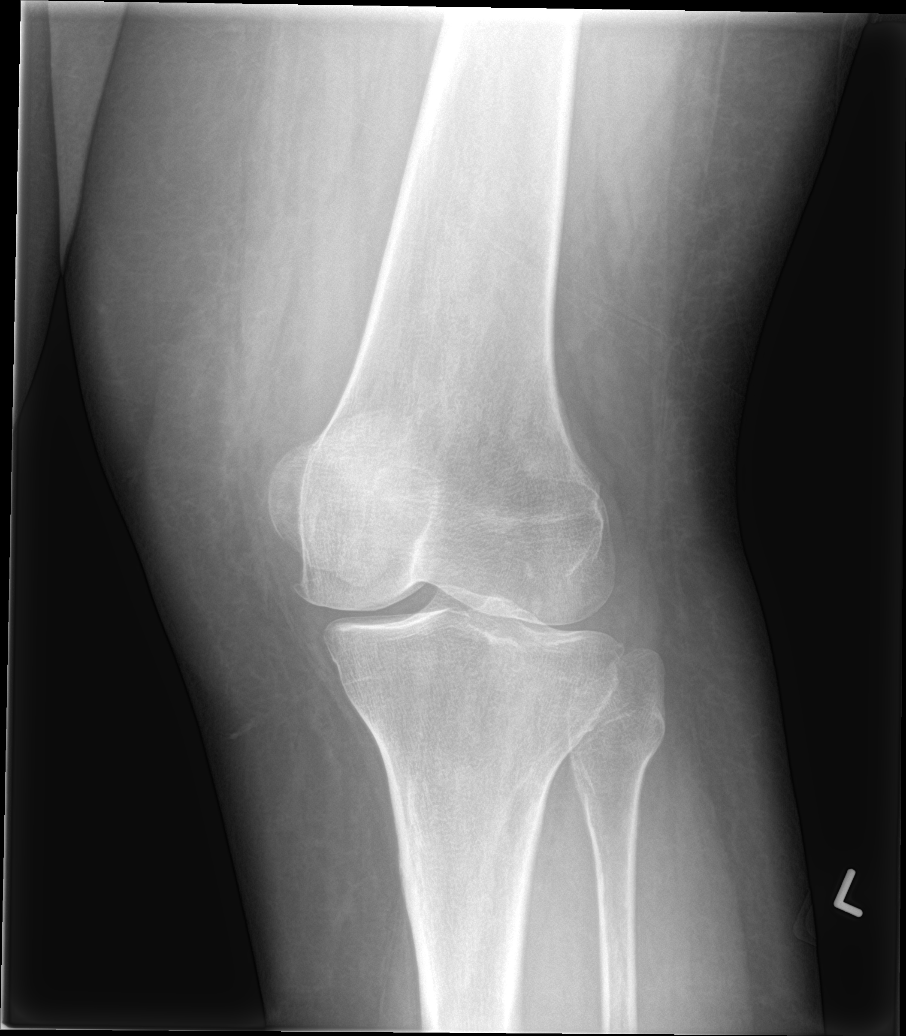

[knee lat (1 of 2)]
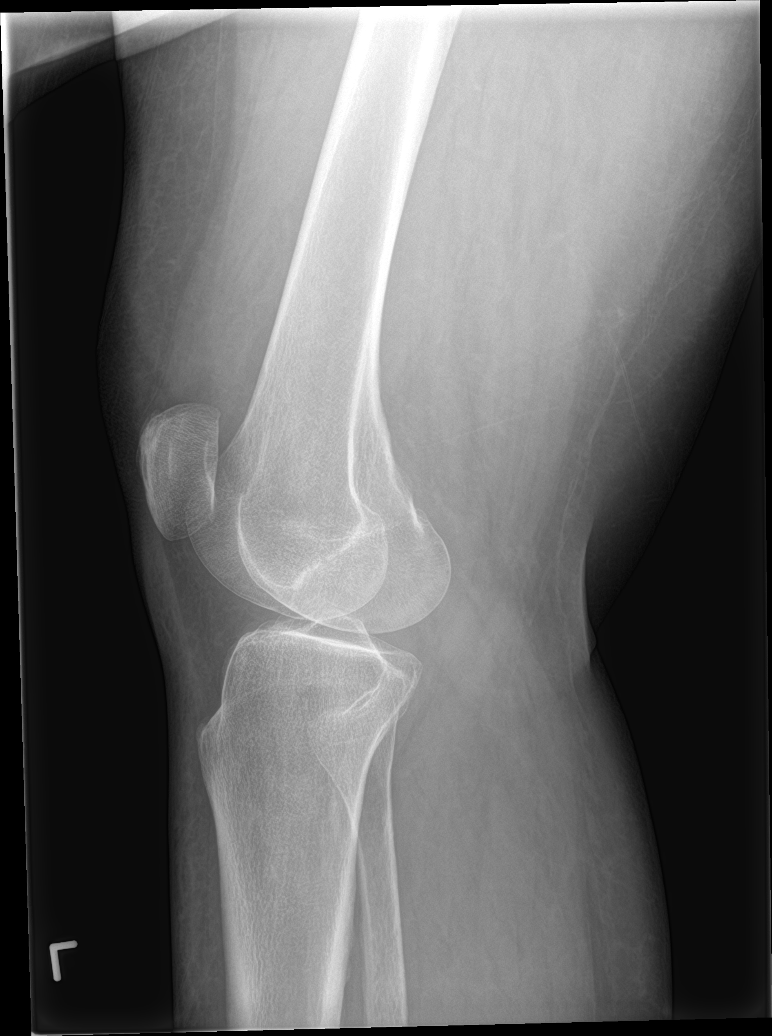

[knee lat (2 of 2)]
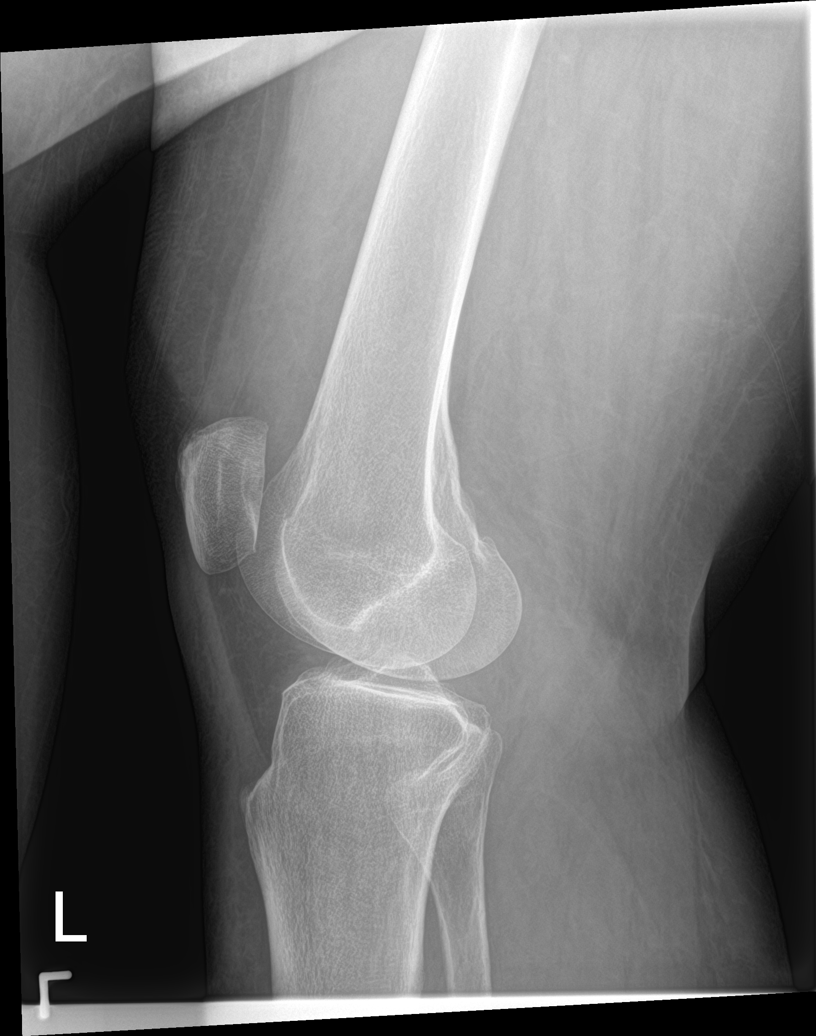

[5 of 5 positions shown; findings below may reference images not displayed]

FINDINGS: No evidence of fracture, dislocation, or joint effusion. No focal
bone abnormality. Small osteophytes about the medial compartment
noted. Soft tissues are unremarkable.
IMPRESSION: No acute abnormality.

## 2021-08-10 NOTE — Progress Notes (Unsigned)
Cardiology Office Note    Date:  08/12/2021   ID:  AANSHI Myers, DOB [birth year], MRN [id number]  PCP:  [facility]  Cardiologist:  Lorine Bears, MD  Electrophysiologist:  None   Chief Complaint: Follow-up  History of Present Illness:   Mindy Myers is a 61 y.o. female with history of hypertensive heart disease with chronic diastolic CHF, HLD, osteoarthritis affecting the bilateral hands, COVID-19 infection in 08/2018, and obesity who presents for follow up of hypertension.   She was previously evaluated in 2013 for atypical chest pain, ruled out and underwent nuclear stress testing that was without evidence of ischemia. She reported being diagnosed with HTN years ago and had reportedly been managed with 3 antihypertensives. She had been out of these medications for several months 2/2 finances prior to her admission in early 03/2017. She was admitted to [facility] in 03/2017 for chest pain and accelerated HTN with an initial BP of 202/86. Cardiac enzymes negative, BNP 83, EKG with sinus rhythm with LVH and repolarization abnormalities. Echo 03/28/17 showed EF of 50-55%, no RWMA, Gr1DD, mildly dilated left atrium, PASP could not be estimated. Lexiscan Myoview 03/28/2017 showed a small defect of mild severity present in the apex location felt to be 2/2 breast attenuation, EF 45-54%, felt to be 2/2 hypertensive heart disease. This was a normal study. She was started on Lipitor, Coreg, irbesartan, and Lasix with a discharge BP of 141/48. Since her admission, she has done well and has been keeping a detailed food diary with strict sodium restriction noted. She was diagnosed with COVID-19 in 08/2018 and did not require hospital admission. Following this diagnosis she noted exertional dyspnea with subsequent D-dimer being elevated. CTA of the chest in 09/2018 was negative for PE. Lower extremity ultrasound was negative for DVT bilaterally. CRP and sed rate were normal. Echo in 10/2018 showed an EF of  50-55%, diastolic dysfunction, mildly dilated LA, and mild MR. She was seen in 01/2019, at which time she was doing well from a cardiac perspective. Home BP readings range from the 1 teens to 140s systolic with rare hypotensive readings in the 90s systolic. Her BP was well controlled in the office. Her weight was up to 283 pounds though this was felt to be secondary to stress eating a more sedentary lifestyle in the setting of her recent COVID-19 infection with noted osteoarthritis as well.  She was seen in the office in 02/2020 and continued to do well from a cardiac perspective.  Her blood pressure remained well controlled.  She was enjoying her new job with decreased stress.  She did note a slight uptick in her weight though upon reviewing her food diary she realized that she had been eating a significant amount of carbs and snacking while at work.  With improvement in diet her weight had returned back to baseline.  She was not interested in revisiting sleep study.  No changes were made.  She was seen in the office in 08/2020 and continued to do very well from a cardiac perspective with most BP readings in the low 100s to 1 teens systolic.  She did note an occasional episode of low BP, which she felt like was due to fluid restriction and dehydration.  She was advised to increase her fluid intake up to 2 L/day.  No medication changes were made.  She was last seen in the office in 02/2021 and was doing well from a cardiac perspective without symptoms of angina or decompensation.  Blood pressures remained well controlled.  No changes were indicated.   She comes in doing well from a cardiac perspective and is without symptoms of angina or decompensation.  No dizziness, presyncope, or syncope.  She does note some bilateral pedal edema if she is up on her feet for extended timeframes.  She works extended hours and is sore when she gets off from work.  This is limiting her ability to exercise on a regular basis.  Her  weight is up 7 pounds today when compared to her last clinic visit.  She does continue to monitor her caloric and sodium intake quite strictly.  Her blood pressure has remained well controlled at home in the 1 teens to 130s systolic.  She is tolerating her medications without issues.     Labs independently reviewed: 02/2021 - potassium 4.0, BUN 18, serum creatinine 0.96 08/2020 - direct LDL 82, TC 146, TG 135, HDL 38, albumin 4.4, AST/ALT normal 12/2017 - Hgb 13.0, PLT 391 03/2017 - A1c 5.4, TSH normal, magnesium 1.8    Past Medical History:  Diagnosis Date   BMI 40.0-44.9, adult (HCC) 05/24/2019   CHF (congestive heart failure) (HCC) 03/27/2017   Chronic diastolic CHF (congestive heart failure) (HCC)    a. TTE 1/19: EF of 50-55%, no RWMA, Gr1DD, mildly dilated left atrium, PASP could not be estimated   COVID-19 08/2018   HLD (hyperlipidemia)    HTN (hypertension) 04/05/2017   Hypertensive heart disease    a. TTE 1/19: EF 50-55%, no RWMA, Gr1DD, mildly dilated left atrium, PASP could not be estimated; b. MV 1/19: small defect of mild severity present in the apex location felt to be 2/2 breast attenuation, EF 45-54% felt to be 2/2 hypertensive heart disease. This was a normal study   Obesity    Strain of knee 12/04/2018    Past Surgical History:  Procedure Laterality Date   HAND SURGERY Left     Current Medications: Current Meds  Medication Sig   aspirin 81 MG EC tablet TAKE 1 TABLET (81 MG TOTAL) BY MOUTH DAILY. SWALLOW WHOLE   atorvastatin (LIPITOR) 40 MG tablet Take 1 tablet (40 mg total) by mouth daily at 6 PM.   carvedilol (COREG) 6.25 MG tablet Take 1 tablet (6.25 mg total) by mouth 2 (two) times daily.   hydrochlorothiazide (MICROZIDE) 12.5 MG capsule TAKE 1 CAPSULE BY MOUTH EVERY DAY   lisinopril (ZESTRIL) 40 MG tablet Take 1 tablet (40 mg total) by mouth daily.   verapamil (CALAN-SR) 240 MG CR tablet Take 1 tablet (240 mg total) by mouth daily.    Allergies:   Amoxicillin    Social History   Socioeconomic History   Marital status: Single    Spouse name: Not on file   Number of children: 2   Years of education: 12   Highest education level: 12th grade  Occupational History   Not on file  Tobacco Use   Smoking status: Never   Smokeless tobacco: Never  Vaping Use   Vaping Use: Never used  Substance and Sexual Activity   Alcohol use: Yes    Comment: 1 glass of wine every 6 months   Drug use: No   Sexual activity: Never  Other Topics Concern   Not on file  Social History Narrative   Not on file   Social Determinants of Health   Financial Resource Strain: Not on file  Food Insecurity: Not on file  Transportation Needs: Not on file  Physical Activity: Not  on file  Stress: Not on file  Social Connections: Not on file     Family History:  The patient's family history includes Cancer in her father; Hypertension in her father and mother.  ROS:   12-point review of systems is negative unless otherwise noted in the HPI.   EKGs/Labs/Other Studies Reviewed:    Studies reviewed were summarized above. The additional studies were reviewed today:  Lexiscan MPI 03/2017: There was no ST segment deviation noted during stress. No T wave inversion was noted during stress. Defect 1: There is a small defect of mild severity present in the apex location. This is likely due to breast attenuation The study is normal. This is a low risk study. The left ventricular ejection fraction is mildly decreased (45-54%). Mildly reduced EF likely due to hypertensive heart disease __________   2D echo 03/2017: - Left ventricle: The cavity size was mildly dilated. There was    moderate concentric hypertrophy. Systolic function was normal.    The estimated ejection fraction was in the range of 50% to 55%.    Wall motion was normal; there were no regional wall motion    abnormalities. Doppler parameters are consistent with abnormal    left ventricular relaxation (grade  1 diastolic dysfunction).  - Left atrium: The atrium was mildly dilated.  - Pulmonary arteries: Systolic pressure could not be accurately    estimated. __________   2D echo 10/2018: 1. The left ventricle has low normal systolic function, with an ejection fraction of 50-55%. The cavity size was normal. Left ventricular diastolic Doppler parameters are consistent with impaired relaxation.  2. The right ventricle has normal systolic function. The cavity was normal. There is no increase in right ventricular wall thickness. Unable to estimate RVSP.  3. Left atrial size was mildly dilated.   EKG:  EKG is ordered today.  The EKG ordered today demonstrates NSR, 74 bpm, LVH,, nonspecific ST-T changes  Recent Labs: 08/27/2020: ALT 10 02/27/2021: BUN 18; Creatinine, Ser 0.96; Potassium 4.0; Sodium 137  Recent Lipid Panel    Component Value Date/Time   CHOL 146 08/27/2020 1403   TRIG 135 08/27/2020 1403   HDL 38 (L) 08/27/2020 1403   CHOLHDL 3.8 08/27/2020 1403   CHOLHDL 5.0 03/29/2017 0421   VLDL 26 03/29/2017 0421   LDLCALC 84 08/27/2020 1403   LDLDIRECT 82 08/27/2020 1403    PHYSICAL EXAM:    VS:  BP 138/88   Pulse 74   Ht 5\' 9"  (1.753 m)   Wt 291 lb 3.2 oz (132.1 kg)   SpO2 97%   BMI 43.00 kg/m   BMI: Body mass index is 43 kg/m.  Physical Exam Vitals reviewed.  Constitutional:      Appearance: She is well-developed.  HENT:     Head: Normocephalic and atraumatic.  Eyes:     General:        Right eye: No discharge.        Left eye: No discharge.  Neck:     Comments: JVD difficult to assess Cardiovascular:     Rate and Rhythm: Normal rate and regular rhythm.     Pulses:          Posterior tibial pulses are 2+ on the right side and 2+ on the left side.     Heart sounds: Normal heart sounds, S1 normal and S2 normal. Heart sounds not distant. No midsystolic click and no opening snap. No murmur heard.   No friction rub.  Pulmonary:  Effort: Pulmonary effort is normal. No  respiratory distress.     Breath sounds: Normal breath sounds. No decreased breath sounds, wheezing or rales.  Chest:     Chest wall: No tenderness.  Abdominal:     General: There is no distension.     Palpations: Abdomen is soft.  Musculoskeletal:     Cervical back: Normal range of motion.     Right lower leg: No edema.     Left lower leg: No edema.  Skin:    General: Skin is warm and dry.     Nails: There is no clubbing.  Neurological:     Mental Status: She is alert and oriented to person, place, and time.  Psychiatric:        Speech: Speech normal.        Behavior: Behavior normal.        Thought Content: Thought content normal.        Judgment: Judgment normal.    Wt Readings from Last 3 Encounters:  08/12/21 291 lb 3.2 oz (132.1 kg)  02/27/21 284 lb (128.8 kg)  08/27/20 284 lb (128.8 kg)     ASSESSMENT & PLAN:   Hypertensive heart disease: Blood pressure is reasonably controlled in the office today.  Blood pressure has been well controlled at home.  She remains on carvedilol, HCTZ, lisinopril, and verapamil.  Check renal function.  Low-sodium diet continues to be encouraged.  Diastolic dysfunction: She appears euvolemic and well compensated.  Continue optimal blood pressure control and current medical therapy.  HLD: LDL 82 in 08/2020.  She remains on atorvastatin 40 mg.  Check CMP, lipid panel, and direct LDL.  Obesity: Weight loss is encouraged through healthy diet and regular exercise.  We did discuss the addition of a medication such as Ozempic, Rybelsus, or Wegovy for weight loss reduction and reduced cardiovascular risk.  However, she reports her father has history of thyroid cancer (type uncertain).  We will need to investigate this further.   Disposition: F/u with Dr. Kirke Corin or an APP in 6 months.   Medication Adjustments/Labs and Tests Ordered: Current medicines are reviewed at length with the patient today.  Concerns regarding medicines are outlined above.  Medication changes, Labs and Tests ordered today are summarized above and listed in the Patient Instructions accessible in Encounters.   Signed, Eula Listen, PA-C 08/12/2021 3:08 PM     [facility] [address] [place], [place] [address] [phone]

## 2021-08-11 ENCOUNTER — Ambulatory Visit: Payer: BLUE CROSS/BLUE SHIELD | Admitting: Physician Assistant

## 2021-08-11 DIAGNOSIS — I119 Hypertensive heart disease without heart failure: Secondary | ICD-10-CM | POA: Insufficient documentation

## 2021-08-11 DIAGNOSIS — E785 Hyperlipidemia, unspecified: Secondary | ICD-10-CM | POA: Insufficient documentation

## 2021-08-11 DIAGNOSIS — E669 Obesity, unspecified: Secondary | ICD-10-CM | POA: Insufficient documentation

## 2021-08-11 DIAGNOSIS — I5032 Chronic diastolic (congestive) heart failure: Secondary | ICD-10-CM | POA: Insufficient documentation

## 2021-08-12 ENCOUNTER — Ambulatory Visit (INDEPENDENT_AMBULATORY_CARE_PROVIDER_SITE_OTHER): Payer: BLUE CROSS/BLUE SHIELD | Admitting: Physician Assistant

## 2021-08-12 ENCOUNTER — Encounter: Payer: Self-pay | Admitting: Physician Assistant

## 2021-08-12 ENCOUNTER — Other Ambulatory Visit
Admission: RE | Admit: 2021-08-12 | Discharge: 2021-08-12 | Disposition: A | Discharge status: Home / Self Care | Payer: BLUE CROSS/BLUE SHIELD | Attending: Emergency Medicine | Admitting: Emergency Medicine

## 2021-08-12 VITALS — BP 138/88 | HR 74 | Ht 69.0 in | Wt 291.2 lb

## 2021-08-12 DIAGNOSIS — I5032 Chronic diastolic (congestive) heart failure: Secondary | ICD-10-CM | POA: Insufficient documentation

## 2021-08-12 DIAGNOSIS — Z79899 Other long term (current) drug therapy: Secondary | ICD-10-CM | POA: Insufficient documentation

## 2021-08-12 DIAGNOSIS — E782 Mixed hyperlipidemia: Secondary | ICD-10-CM | POA: Diagnosis not present

## 2021-08-12 DIAGNOSIS — I5189 Other ill-defined heart diseases: Secondary | ICD-10-CM

## 2021-08-12 DIAGNOSIS — Z6841 Body Mass Index (BMI) 40.0 and over, adult: Secondary | ICD-10-CM | POA: Insufficient documentation

## 2021-08-12 DIAGNOSIS — I110 Hypertensive heart disease with heart failure: Secondary | ICD-10-CM | POA: Insufficient documentation

## 2021-08-12 DIAGNOSIS — E785 Hyperlipidemia, unspecified: Secondary | ICD-10-CM | POA: Insufficient documentation

## 2021-08-12 LAB — LIPID PANEL
Cholesterol: 163 mg/dL (ref 0–200)
HDL: 41 mg/dL (ref >40)
LDL Cholesterol: 91 mg/dL (ref 0–99)
Total CHOL/HDL Ratio: 4 RATIO
Triglycerides: 155 mg/dL — ABNORMAL HIGH (ref <150)
VLDL: 31 mg/dL (ref 0–40)

## 2021-08-12 LAB — COMPREHENSIVE METABOLIC PANEL
ALT: 16 U/L (ref 0–44)
AST: 20 U/L (ref 15–41)
Albumin: 4 g/dL (ref 3.5–5.0)
Alkaline Phosphatase: 90 U/L (ref 38–126)
Anion gap: 10 (ref 5–15)
BUN: 17 mg/dL (ref 8–23)
CO2: 26 mmol/L (ref 22–32)
Calcium: 8.9 mg/dL (ref 8.9–10.3)
Chloride: 103 mmol/L (ref 98–111)
Creatinine, Ser: 0.89 mg/dL (ref 0.44–1.00)
GFR, Estimated: >60 mL/min (ref >60)
Glucose, Bld: 95 mg/dL (ref 70–99)
Potassium: 3.9 mmol/L (ref 3.5–5.1)
Sodium: 139 mmol/L (ref 135–145)
Total Bilirubin: 0.6 mg/dL (ref 0.3–1.2)
Total Protein: 7.5 g/dL (ref 6.5–8.1)

## 2021-08-12 LAB — LDL CHOLESTEROL, DIRECT: Direct LDL: 95 mg/dL (ref 0–99)

## 2021-08-12 LAB — TSH: TSH: 2.139 u[IU]/mL (ref 0.350–4.500)

## 2021-08-12 NOTE — Patient Instructions (Addendum)
Medication Instructions:  No changes at this time.   *If you need a refill on your cardiac medications before your next appointment, please call your pharmacy*   Lab Work: Lipid, direct LDL, cmet, tsh today over at the [facility] entrance and check in at registration.   If you have labs (blood work) drawn today and your tests are completely normal, you will receive your results only by: [place] (if you have MyChart) OR A paper copy in the mail If you have any lab test that is abnormal or we need to change your treatment, we will call you to review the results.   Testing/Procedures: None   Follow-Up: At [facility], you and your health needs are our priority.  As part of our continuing mission to provide you with exceptional heart care, we have created designated Provider Care Teams.  These Care Teams include your primary Cardiologist (physician) and Advanced Practice Providers (APPs -  Physician Assistants and Nurse Practitioners) who all work together to provide you with the care you need, when you need it.   Your next appointment:   6 month(s)  The format for your next appointment:   In Person  Provider:   Kathlyn Sacramento, MD or Christell Faith, PA-C         Important Information About Sugar

## 2021-08-21 ENCOUNTER — Other Ambulatory Visit: Payer: Self-pay | Admitting: Physician Assistant

## 2021-08-24 ENCOUNTER — Telehealth: Payer: Self-pay | Admitting: Physician Assistant

## 2021-08-24 ENCOUNTER — Other Ambulatory Visit: Payer: Self-pay | Admitting: *Deleted

## 2021-08-24 MED ORDER — CARVEDILOL 6.25 MG PO TABS: 3.1250 mg | ORAL_TABLET | Freq: Two times a day (BID) | ORAL | 3 refills | Status: DC

## 2021-08-24 NOTE — Telephone Encounter (Signed)
Pt c/o BP issue: STAT if pt c/o blurred vision, one-sided weakness or slurred speech  1. What are your last 5 BP readings?  108/67 103/66 93/59 89/62  101/66  2. Are you having any other symptoms (ex. Dizziness, headache, blurred vision, passed out)? Dizzy and lightheaded  3. What is your BP issue? Pt states that BP has been running low since the 29th of May. Pt would like a call back. Please advise

## 2021-08-24 NOTE — Telephone Encounter (Signed)
The patient also sent a Mychart message for the same issue. I routed the MyChart message to Ward Givens, NP to review and advise in Ryan's absence.

## 2021-08-24 NOTE — Telephone Encounter (Signed)
Responded to the patient through her MyChart message with Ward Givens, NP's recommendations as stated below:  I'm sorry to hear that you are feeling poorly.  I'm covering for Alycia Rossetti this week.  I recommend cutting your carvedilol in half and start taking 3.125 mg twice daily.  I think it's reasonable to hold your BP medicines if your systolic blood pressure is < 90.   See MyChart encounter.

## 2021-09-11 ENCOUNTER — Other Ambulatory Visit: Payer: Self-pay | Admitting: Physician Assistant

## 2021-11-21 ENCOUNTER — Other Ambulatory Visit: Payer: Self-pay | Admitting: Physician Assistant

## 2022-02-12 ENCOUNTER — Ambulatory Visit: Payer: BLUE CROSS/BLUE SHIELD | Admitting: Physician Assistant

## 2022-02-15 NOTE — Progress Notes (Signed)
Cardiology Office Note    Date:  02/19/2022   ID:  Mindy Myers, DOB [birth year], MRN [id number]  PCP:  [facility]  Cardiologist:  Lorine Bears, MD  Electrophysiologist:  None   Chief Complaint: Follow-up  History of Present Illness:   Mindy Myers is a 61 y.o. female with history of hypertensive heart disease with chronic diastolic CHF, HLD, osteoarthritis affecting the bilateral hands, COVID-19 infection in 08/2018, and obesity who presents for follow up of hypertension.   She was previously evaluated in 2013 for atypical chest pain, ruled out and underwent nuclear stress testing that was without evidence of ischemia. She reported being diagnosed with HTN years ago and had reportedly been managed with 3 antihypertensives. She had been out of these medications for several months secondary to finances prior to her admission in early 03/2017. She was admitted to [facility] in 03/2017 for chest pain and accelerated HTN with an initial BP of 202/86. Cardiac enzymes negative, BNP 83, EKG with sinus rhythm with LVH and repolarization abnormalities. Echo 03/28/17 showed EF of 50-55%, no RWMA, Gr1DD, mildly dilated left atrium, PASP could not be estimated. Lexiscan MPI in 03/2017 showed a small defect of mild severity present in the apex location felt to be secondary to breast attenuation, EF 45-54%, felt to be in the setting of hypertensive heart disease. This was a normal study. She was started on Lipitor, Coreg, irbesartan, and Lasix. Since her admission, she has done well and has been keeping a detailed food diary with strict sodium restriction noted. She was diagnosed with COVID-19 in 08/2018 and did not require hospital admission. Following this diagnosis she noted exertional dyspnea with subsequent D-dimer being elevated. CTA of the chest in 09/2018 was negative for PE. Lower extremity ultrasound was negative for DVT bilaterally. CRP and sed rate were normal. Echo in 10/2018 showed an EF of  50-55%, diastolic dysfunction, mildly dilated LA, and mild mitral regurgitation. She was seen in 01/2019, at which time she was doing well from a cardiac perspective. Home BP readings range from the 1 teens to 140s systolic with rare hypotensive readings in the 90s systolic. Her BP was well controlled in the office. Her weight was up to 283 pounds though this was felt to be secondary to stress eating a more sedentary lifestyle in the setting of her recent COVID-19 infection with noted osteoarthritis as well.  She was seen in the office in 02/2020 and continued to do well from a cardiac perspective.  Her blood pressure remained well controlled.  She was enjoying her new job with decreased stress.  She did note a slight uptick in her weight though upon reviewing her food diary she realized that she had been eating a significant amount of carbs and snacking while at work.  With improvement in diet her weight had returned back to baseline.  She was not interested in revisiting sleep study.  No changes were made.  She was seen in the office in 08/2020 and continued to do very well from a cardiac perspective with most BP readings in the low 100s to 1 teens systolic.  She did note an occasional episode of low BP, which she felt like was due to fluid restriction and dehydration.  She was advised to increase her fluid intake up to 2 L/day.  No medication changes were made.  She was seen in the office in 02/2021 and was doing well from a cardiac perspective without symptoms of angina or decompensation.  Blood pressures remained well controlled.  No changes were indicated.  She was last seen in the office in 07/2021 and was without symptoms of angina or decompensation.  She did note some bilateral pedal swelling if she was up on her feet for extended timeframes.  She continue to monitor her caloric and sodium intake closely.  Blood pressures at home were in the 1 teens to 130s systolic.  Since she was last seen, her carvedilol  has been decreased to 3.125 mg twice daily with noted soft BP and dizziness/lightheadedness.  She comes in today doing well from a cardiac perspective and is without symptoms of angina or decompensation.  She is now back on 6.25 mg twice daily of carvedilol with continuation of HCTZ, lisinopril, and verapamil given noted increase in BP over the past several weeks predominantly into the 130s to 140s systolic.  No dizziness, presyncope, or syncope.  No progressive orthopnea.  She continues to monitor her sodium intake very closely.  Her weight is down 11 pounds today when compared to her last clinic visit.  She indicates this is intentional with lifestyle modification and walking regimen.   Labs independently reviewed: 07/2021 - TSH normal, potassium 3.9, BUN 17, serum creatinine 0.89, albumin 4.0, AST/ALT normal, direct LDL 95, TC 163, TG 155, HDL 41 12/2017 - Hgb 13.0, PLT 391 03/2017 - A1c 5.4  Past Medical History:  Diagnosis Date   BMI 40.0-44.9, adult (HCC) 05/24/2019   CHF (congestive heart failure) (HCC) 03/27/2017   Chronic diastolic CHF (congestive heart failure) (HCC)    a. TTE 1/19: EF of 50-55%, no RWMA, Gr1DD, mildly dilated left atrium, PASP could not be estimated   COVID-19 08/2018   HLD (hyperlipidemia)    HTN (hypertension) 04/05/2017   Hypertensive heart disease    a. TTE 1/19: EF 50-55%, no RWMA, Gr1DD, mildly dilated left atrium, PASP could not be estimated; b. MV 1/19: small defect of mild severity present in the apex location felt to be 2/2 breast attenuation, EF 45-54% felt to be 2/2 hypertensive heart disease. This was a normal study   Obesity    Strain of knee 12/04/2018    Past Surgical History:  Procedure Laterality Date   HAND SURGERY Left     Current Medications: Current Meds  Medication Sig   aspirin EC (ASPIRIN LOW DOSE) 81 MG tablet TAKE 1 TABLET (81 MG TOTAL) BY MOUTH DAILY. SWALLOW WHOLE   carvedilol (COREG) 12.5 MG tablet Take 1 tablet (12.5 mg total) by  mouth 2 (two) times daily.   [DISCONTINUED] atorvastatin (LIPITOR) 40 MG tablet TAKE 1 TABLET BY MOUTH DAILY AT 6 PM.   [DISCONTINUED] carvedilol (COREG) 6.25 MG tablet TAKE 1 TABLET BY MOUTH TWICE A DAY   [DISCONTINUED] hydrochlorothiazide (MICROZIDE) 12.5 MG capsule TAKE 1 CAPSULE BY MOUTH EVERY DAY   [DISCONTINUED] lisinopril (ZESTRIL) 40 MG tablet TAKE 1 TABLET BY MOUTH EVERY DAY   [DISCONTINUED] verapamil (CALAN-SR) 240 MG CR tablet TAKE 1 TABLET BY MOUTH EVERY DAY    Allergies:   Amoxicillin   Social History   Socioeconomic History   Marital status: Single    Spouse name: Not on file   Number of children: 2   Years of education: 12   Highest education level: 12th grade  Occupational History   Not on file  Tobacco Use   Smoking status: Never   Smokeless tobacco: Never  Vaping Use   Vaping Use: Never used  Substance and Sexual Activity   Alcohol use:  Yes    Comment: 1 glass of wine every 6 months   Drug use: No   Sexual activity: Never  Other Topics Concern   Not on file  Social History Narrative   Not on file   Social Determinants of Health   Financial Resource Strain: Low Risk  (04/05/2017)   Overall Financial Resource Strain (CARDIA)    Difficulty of Paying Living Expenses: Not hard at all  Food Insecurity: Food Insecurity Present (04/05/2017)   Hunger Vital Sign    Worried About Running Out of Food in the Last Year: Often true    Ran Out of Food in the Last Year: Often true  Transportation Needs: No Transportation Needs (04/05/2017)   PRAPARE - Administrator, Civil Service (Medical): No    Lack of Transportation (Non-Medical): No  Physical Activity: Inactive (04/05/2017)   Exercise Vital Sign    Days of Exercise per Week: 0 days    Minutes of Exercise per Session: 0 min  Stress: Stress Concern Present (04/05/2017)   [employer] of Occupational Health - Occupational Stress Questionnaire    Feeling of Stress : To some extent  Social  Connections: Moderately Isolated (04/05/2017)   Social Connection and Isolation Panel [NHANES]    Frequency of Communication with Friends and Family: More than three times a week    Frequency of Social Gatherings with Friends and Family: Never    Attends Religious Services: Never    Database administrator or Organizations: No    Attends Engineer, structural: Never    Marital Status: Divorced     Family History:  The patient's family history includes Cancer in her father; Hypertension in her father and mother.  ROS:   12-point review of systems is negative unless otherwise noted in the HPI.   EKGs/Labs/Other Studies Reviewed:    Studies reviewed were summarized above. The additional studies were reviewed today:  Lexiscan MPI 03/2017: There was no ST segment deviation noted during stress. No T wave inversion was noted during stress. Defect 1: There is a small defect of mild severity present in the apex location. This is likely due to breast attenuation The study is normal. This is a low risk study. The left ventricular ejection fraction is mildly decreased (45-54%). Mildly reduced EF likely due to hypertensive heart disease __________   2D echo 03/2017: - Left ventricle: The cavity size was mildly dilated. There was    moderate concentric hypertrophy. Systolic function was normal.    The estimated ejection fraction was in the range of 50% to 55%.    Wall motion was normal; there were no regional wall motion    abnormalities. Doppler parameters are consistent with abnormal    left ventricular relaxation (grade 1 diastolic dysfunction).  - Left atrium: The atrium was mildly dilated.  - Pulmonary arteries: Systolic pressure could not be accurately    estimated. __________   2D echo 10/2018: 1. The left ventricle has low normal systolic function, with an ejection fraction of 50-55%. The cavity size was normal. Left ventricular diastolic Doppler parameters are consistent with  impaired relaxation.  2. The right ventricle has normal systolic function. The cavity was normal. There is no increase in right ventricular wall thickness. Unable to estimate RVSP.  3. Left atrial size was mildly dilated.   EKG:  EKG is ordered today.  The EKG ordered today demonstrates NSR, 60 bpm, no acute ST-T changes  Recent Labs: 08/12/2021: ALT 16; BUN 17;  Creatinine, Ser 0.89; Potassium 3.9; Sodium 139; TSH 2.139  Recent Lipid Panel    Component Value Date/Time   CHOL 163 08/12/2021 1429   CHOL 146 08/27/2020 1403   TRIG 155 (H) 08/12/2021 1429   HDL 41 08/12/2021 1429   HDL 38 (L) 08/27/2020 1403   CHOLHDL 4.0 08/12/2021 1429   VLDL 31 08/12/2021 1429   LDLCALC 91 08/12/2021 1429   LDLCALC 84 08/27/2020 1403   LDLDIRECT 95.0 08/12/2021 1429    PHYSICAL EXAM:    VS:  BP (!) 128/90 (BP Location: Left Arm, Patient Position: Sitting, Cuff Size: Large)   Pulse 60   Ht 5\' 9"  (1.753 m)   Wt 280 lb 8 oz (127.2 kg)   SpO2 96%   BMI 41.42 kg/m   BMI: Body mass index is 41.42 kg/m.  Physical Exam Vitals reviewed.  Constitutional:      Appearance: She is well-developed.  HENT:     Head: Normocephalic and atraumatic.  Eyes:     General:        Right eye: No discharge.        Left eye: No discharge.  Cardiovascular:     Rate and Rhythm: Normal rate and regular rhythm.     Heart sounds: Normal heart sounds, S1 normal and S2 normal. Heart sounds not distant. No midsystolic click and no opening snap. No murmur heard.    No friction rub.  Pulmonary:     Effort: Pulmonary effort is normal. No respiratory distress.     Breath sounds: Normal breath sounds. No decreased breath sounds, wheezing or rales.  Chest:     Chest wall: No tenderness.  Abdominal:     General: There is no distension.  Musculoskeletal:     Cervical back: Normal range of motion.  Skin:    General: Skin is warm and dry.     Nails: There is no clubbing.  Neurological:     Mental Status: She is alert  and oriented to person, place, and time.  Psychiatric:        Speech: Speech normal.        Behavior: Behavior normal.        Thought Content: Thought content normal.        Judgment: Judgment normal.     Wt Readings from Last 3 Encounters:  02/19/22 280 lb 8 oz (127.2 kg)  08/12/21 291 lb 3.2 oz (132.1 kg)  02/27/21 284 lb (128.8 kg)     ASSESSMENT & PLAN:   Hypertensive heart disease: Blood pressure is reasonably controlled in the office today, though has been running on the higher side at home recently.  Titrate carvedilol to 12.5 mg twice daily with continuation of HCTZ, lisinopril, and verapamil.  Continued low-sodium diet is encouraged.  Recent labs showed stable renal function and electrolytes.  Diastolic dysfunction: Euvolemic, compensated.  Continue optimal blood pressure control and current medical therapy.  HLD: LDL 95 in 07/2021 with normal AST/ALT at that time.  She remains on atorvastatin.  Obesity: Weight is down 11 pounds today when compared to her last clinic visit in 07/2021.  Continued weight loss is encouraged with heart of the diet and regular exercise.    Disposition: F/u with Dr. 08/2021 or an APP in 6 months.   Medication Adjustments/Labs and Tests Ordered: Current medicines are reviewed at length with the patient today.  Concerns regarding medicines are outlined above. Medication changes, Labs and Tests ordered today are summarized above and listed in the  Patient Instructions accessible in Encounters.   Signed, Eula Listenyan Brylee Mcgreal, PA-C 02/19/2022 4:19 PM     [facility] [place] [address] [place], [place] [address] [phone]

## 2022-02-19 ENCOUNTER — Encounter: Payer: Self-pay | Admitting: Physician Assistant

## 2022-02-19 ENCOUNTER — Ambulatory Visit: Payer: BLUE CROSS/BLUE SHIELD | Attending: Physician Assistant | Admitting: Physician Assistant

## 2022-02-19 VITALS — BP 128/90 | HR 60 | Ht 69.0 in | Wt 280.5 lb

## 2022-02-19 DIAGNOSIS — Z6841 Body Mass Index (BMI) 40.0 and over, adult: Secondary | ICD-10-CM

## 2022-02-19 DIAGNOSIS — E785 Hyperlipidemia, unspecified: Secondary | ICD-10-CM

## 2022-02-19 DIAGNOSIS — I5189 Other ill-defined heart diseases: Secondary | ICD-10-CM

## 2022-02-19 DIAGNOSIS — I110 Hypertensive heart disease with heart failure: Secondary | ICD-10-CM | POA: Diagnosis not present

## 2022-02-19 DIAGNOSIS — I5032 Chronic diastolic (congestive) heart failure: Secondary | ICD-10-CM

## 2022-02-19 MED ORDER — VERAPAMIL HCL ER 240 MG PO TBCR: 240.0000 mg | EXTENDED_RELEASE_TABLET | Freq: Every day | ORAL | 2 refills | Status: DC

## 2022-02-19 MED ORDER — LISINOPRIL 40 MG PO TABS: 40.0000 mg | ORAL_TABLET | Freq: Every day | ORAL | 3 refills | Status: DC

## 2022-02-19 MED ORDER — HYDROCHLOROTHIAZIDE 12.5 MG PO CAPS: 12.5000 mg | ORAL_CAPSULE | Freq: Every day | ORAL | 3 refills | Status: DC

## 2022-02-19 MED ORDER — CARVEDILOL 12.5 MG PO TABS: 12.5000 mg | ORAL_TABLET | Freq: Two times a day (BID) | ORAL | 3 refills | Status: DC

## 2022-02-19 MED ORDER — ATORVASTATIN CALCIUM 40 MG PO TABS: 40.0000 mg | ORAL_TABLET | Freq: Every day | ORAL | 3 refills | Status: DC

## 2022-02-19 NOTE — Patient Instructions (Addendum)
Medication Instructions:  Your physician has recommended you make the following change in your medication:   INCREASE Carvedilol 12.5 mg twice a day  *If you need a refill on your cardiac medications before your next appointment, please call your pharmacy*   Lab Work: None  If you have labs (blood work) drawn today and your tests are completely normal, you will receive your results only by: MyChart Message (if you have MyChart) OR A paper copy in the mail If you have any lab test that is abnormal or we need to change your treatment, we will call you to review the results.   Testing/Procedures: None   Follow-Up: At [facility], you and your health needs are our priority.  As part of our continuing mission to provide you with exceptional heart care, we have created designated Provider Care Teams.  These Care Teams include your primary Cardiologist (physician) and Advanced Practice Providers (APPs -  Physician Assistants and Nurse Practitioners) who all work together to provide you with the care you need, when you need it.   Your next appointment:   6 month(s)  The format for your next appointment:   In Person  Provider:   Lorine Bears, MD or Eula Listen, PA-C      Important Information About Sugar

## 2022-09-14 NOTE — Progress Notes (Unsigned)
Cardiology Office Note    Date:  09/15/2022   ID:  Mindy Myers, DOB March 01, 1961, MRN [id number]  PCP:  [facility]  Cardiologist:  Lorine Bears, MD  Electrophysiologist:  None   Chief Complaint: Follow up  History of Present Illness:   Mindy Myers is a 62 y.o. female with history of hypertensive heart disease with chronic diastolic CHF, HLD, osteoarthritis affecting the bilateral hands, COVID-19 infection in 08/2018, and obesity who presents for follow up of hypertension.   She was previously evaluated in 2013 for atypical chest pain, ruled out and underwent nuclear stress testing that was without evidence of ischemia. She reported being diagnosed with HTN years ago and had reportedly been managed with 3 antihypertensives. She had been out of these medications for several months secondary to finances prior to her admission in early 03/2017. She was admitted to [facility] in 03/2017 for chest pain and accelerated HTN with an initial BP of 202/86. Cardiac enzymes negative, BNP 83, EKG with sinus rhythm with LVH and repolarization abnormalities. Echo 03/28/17 showed EF of 50-55%, no RWMA, Gr1DD, mildly dilated left atrium, PASP could not be estimated. Lexiscan MPI in 03/2017 showed a small defect of mild severity present in the apex location felt to be secondary to breast attenuation, EF 45-54%, felt to be in the setting of hypertensive heart disease. This was a normal study. She was started on Lipitor, Coreg, irbesartan, and Lasix. Since her admission, she has done well and has been keeping a detailed food diary with strict sodium restriction noted. She was diagnosed with COVID-19 in 08/2018 and did not require hospital admission. Following this diagnosis she noted exertional dyspnea with subsequent D-dimer being elevated. CTA of the chest in 09/2018 was negative for PE. Lower extremity ultrasound was negative for DVT bilaterally. CRP and sed rate were normal. Echo in 10/2018 showed an EF of  50-55%, diastolic dysfunction, mildly dilated LA, and mild mitral regurgitation.  She was last seen in the office in 02/2022 and remained without symptoms of angina or cardiac decompensation.  She was taking carvedilol 6.25 mg twice daily, HCTZ 12.5 mg, lisinopril 40 mg, verapamil to 240 mg.   She comes in doing well from a cardiac perspective and is without symptoms of angina or cardiac decompensation.  No palpitations, dizziness, presyncope, or syncope.  She is under increased stress due to financial constraints.  In this setting, she is finding it difficult to obtain food, particularly low-sodium.  She has had to reach out to local food banks.  She is also working with her pharmacy to try and reduce cost of medications.  She continues to very detailed food and blood pressure log with blood pressure is overall well-controlled at home.  Adherent and tolerating cardiac medications.   Labs independently reviewed: 07/2021 - TSH normal, potassium 3.9, BUN 17, serum creatinine 0.89, albumin 4.0, AST/ALT normal, direct LDL 95, TC 163, TG 155, HDL 41 12/2017 - Hgb 13.0, PLT 391 03/2017 - A1c 5.4  Past Medical History:  Diagnosis Date   BMI 40.0-44.9, adult (HCC) 05/24/2019   CHF (congestive heart failure) (HCC) 03/27/2017   Chronic diastolic CHF (congestive heart failure) (HCC)    a. TTE 1/19: EF of 50-55%, no RWMA, Gr1DD, mildly dilated left atrium, PASP could not be estimated   COVID-19 08/2018   HLD (hyperlipidemia)    HTN (hypertension) 04/05/2017   Hypertensive heart disease    a. TTE 1/19: EF 50-55%, no RWMA, Gr1DD, mildly dilated left atrium,  PASP could not be estimated; b. MV 1/19: small defect of mild severity present in the apex location felt to be 2/2 breast attenuation, EF 45-54% felt to be 2/2 hypertensive heart disease. This was a normal study   Obesity    Strain of knee 12/04/2018    Past Surgical History:  Procedure Laterality Date   HAND SURGERY Left     Current Medications: Current  Meds  Medication Sig   aspirin EC (ASPIRIN LOW DOSE) 81 MG tablet TAKE 1 TABLET (81 MG TOTAL) BY MOUTH DAILY. SWALLOW WHOLE   atorvastatin (LIPITOR) 40 MG tablet Take 1 tablet (40 mg total) by mouth daily.   carvedilol (COREG) 12.5 MG tablet Take 1 tablet (12.5 mg total) by mouth 2 (two) times daily.   hydrochlorothiazide (MICROZIDE) 12.5 MG capsule Take 1 capsule (12.5 mg total) by mouth daily.   lisinopril (ZESTRIL) 40 MG tablet Take 1 tablet (40 mg total) by mouth daily.   verapamil (CALAN-SR) 240 MG CR tablet Take 1 tablet (240 mg total) by mouth daily.    Allergies:   Amoxicillin   Social History   Socioeconomic History   Marital status: Single    Spouse name: Not on file   Number of children: 2   Years of education: 12   Highest education level: 12th grade  Occupational History   Not on file  Tobacco Use   Smoking status: Never   Smokeless tobacco: Never  Vaping Use   Vaping Use: Never used  Substance and Sexual Activity   Alcohol use: Yes    Comment: 1 glass of wine every 6 months   Drug use: No   Sexual activity: Never  Other Topics Concern   Not on file  Social History Narrative   Not on file   Social Determinants of Health   Financial Resource Strain: Low Risk  (04/05/2017)   Overall Financial Resource Strain (CARDIA)    Difficulty of Paying Living Expenses: Not hard at all  Food Insecurity: Food Insecurity Present (04/05/2017)   Hunger Vital Sign    Worried About Running Out of Food in the Last Year: Often true    Ran Out of Food in the Last Year: Often true  Transportation Needs: No Transportation Needs (04/05/2017)   PRAPARE - Administrator, Civil Service (Medical): No    Lack of Transportation (Non-Medical): No  Physical Activity: Inactive (04/05/2017)   Exercise Vital Sign    Days of Exercise per Week: 0 days    Minutes of Exercise per Session: 0 min  Stress: Stress Concern Present (04/05/2017)   [employer] of Occupational Health -  Occupational Stress Questionnaire    Feeling of Stress : To some extent  Social Connections: Moderately Isolated (04/05/2017)   Social Connection and Isolation Panel [NHANES]    Frequency of Communication with Friends and Family: More than three times a week    Frequency of Social Gatherings with Friends and Family: Never    Attends Religious Services: Never    Database administrator or Organizations: No    Attends Engineer, structural: Never    Marital Status: Divorced     Family History:  The patient's family history includes Cancer in her father; Hypertension in her father and mother.  ROS:   12-point review of systems is negative unless otherwise noted in the HPI.   EKGs/Labs/Other Studies Reviewed:    Studies reviewed were summarized above. The additional studies were reviewed today:  Lexiscan MPI  03/2017: There was no ST segment deviation noted during stress. No T wave inversion was noted during stress. Defect 1: There is a small defect of mild severity present in the apex location. This is likely due to breast attenuation The study is normal. This is a low risk study. The left ventricular ejection fraction is mildly decreased (45-54%). Mildly reduced EF likely due to hypertensive heart disease __________   2D echo 03/2017: - Left ventricle: The cavity size was mildly dilated. There was    moderate concentric hypertrophy. Systolic function was normal.    The estimated ejection fraction was in the range of 50% to 55%.    Wall motion was normal; there were no regional wall motion    abnormalities. Doppler parameters are consistent with abnormal    left ventricular relaxation (grade 1 diastolic dysfunction).  - Left atrium: The atrium was mildly dilated.  - Pulmonary arteries: Systolic pressure could not be accurately    estimated. __________   2D echo 10/2018: 1. The left ventricle has low normal systolic function, with an ejection fraction of 50-55%. The cavity  size was normal. Left ventricular diastolic Doppler parameters are consistent with impaired relaxation.  2. The right ventricle has normal systolic function. The cavity was normal. There is no increase in right ventricular wall thickness. Unable to estimate RVSP.  3. Left atrial size was mildly dilated.   EKG:  EKG is ordered today.  The EKG ordered today demonstrates sinus bradycardia, 55 bpm, LVH, no acute ST-T changes  Recent Labs: No results found for requested labs within last 365 days.  Recent Lipid Panel    Component Value Date/Time   CHOL 163 08/12/2021 1429   CHOL 146 08/27/2020 1403   TRIG 155 (H) 08/12/2021 1429   HDL 41 08/12/2021 1429   HDL 38 (L) 08/27/2020 1403   CHOLHDL 4.0 08/12/2021 1429   VLDL 31 08/12/2021 1429   LDLCALC 91 08/12/2021 1429   LDLCALC 84 08/27/2020 1403   LDLDIRECT 95.0 08/12/2021 1429    PHYSICAL EXAM:    VS:  BP 116/68 (BP Location: Left Arm, Patient Position: Sitting, Cuff Size: Large)   Pulse (!) 55   Ht 5\' 9"  (1.753 m)   Wt 279 lb 6.4 oz (126.7 kg)   SpO2 97%   BMI 41.26 kg/m   BMI: Body mass index is 41.26 kg/m.  Physical Exam Vitals reviewed.  Constitutional:      Appearance: She is well-developed.  HENT:     Head: Normocephalic and atraumatic.  Eyes:     General:        Right eye: No discharge.        Left eye: No discharge.  Neck:     Vascular: No JVD.  Cardiovascular:     Rate and Rhythm: Normal rate and regular rhythm.     Heart sounds: Normal heart sounds, S1 normal and S2 normal. Heart sounds not distant. No midsystolic click and no opening snap. No murmur heard.    No friction rub.  Pulmonary:     Effort: Pulmonary effort is normal. No respiratory distress.     Breath sounds: Normal breath sounds. No decreased breath sounds, wheezing or rales.  Chest:     Chest wall: No tenderness.  Abdominal:     General: There is no distension.  Musculoskeletal:     Cervical back: Normal range of motion.  Skin:    General:  Skin is warm and dry.     Nails: There is  no clubbing.  Neurological:     Mental Status: She is alert and oriented to person, place, and time.  Psychiatric:        Speech: Speech normal.        Behavior: Behavior normal.        Thought Content: Thought content normal.        Judgment: Judgment normal.     Wt Readings from Last 3 Encounters:  09/15/22 279 lb 6.4 oz (126.7 kg)  02/19/22 280 lb 8 oz (127.2 kg)  08/12/21 291 lb 3.2 oz (132.1 kg)     ASSESSMENT & PLAN:   Hypertensive heart disease: Blood pressure is well-controlled in the office.  She remains on carvedilol 12.5 mg twice daily, HCTZ 12.5 mg daily, lisinopril 40 mg daily, and verapamil to 40 mg daily.  Diastolic dysfunction: Euvolemic and well compensated.  Continued optimal blood pressure control and low-sodium diet is encouraged.  HLD: LDL 95 in 07/2021 with normal AST/ALT at that time.  Remains on atorvastatin 40 mg.  Obesity: Weight loss through heart healthy diet and regular exercise is recommended.  Financial constraints with food insecurity: Referred to social work and medication management.  In an effort to minimize financial burden, I elected to defer labs at this time.  We will revisit this in follow-up.   Disposition: F/u with Dr. Kirke Corin or an APP in 6 months.   Medication Adjustments/Labs and Tests Ordered: Current medicines are reviewed at length with the patient today.  Concerns regarding medicines are outlined above. Medication changes, Labs and Tests ordered today are summarized above and listed in the Patient Instructions accessible in Encounters.   Signed, Eula Listen, PA-C 09/15/2022 2:53 PM     [facility] [place] [address] [place], [place] [address] [phone]

## 2022-09-15 ENCOUNTER — Ambulatory Visit: Payer: BC Managed Care – PPO | Attending: Physician Assistant | Admitting: Physician Assistant

## 2022-09-15 ENCOUNTER — Encounter: Payer: Self-pay | Admitting: Physician Assistant

## 2022-09-15 VITALS — BP 116/68 | HR 55 | Ht 69.0 in | Wt 279.4 lb

## 2022-09-15 DIAGNOSIS — E785 Hyperlipidemia, unspecified: Secondary | ICD-10-CM | POA: Diagnosis not present

## 2022-09-15 DIAGNOSIS — I5032 Chronic diastolic (congestive) heart failure: Secondary | ICD-10-CM

## 2022-09-15 DIAGNOSIS — I5189 Other ill-defined heart diseases: Secondary | ICD-10-CM

## 2022-09-15 DIAGNOSIS — Z79899 Other long term (current) drug therapy: Secondary | ICD-10-CM | POA: Diagnosis not present

## 2022-09-15 DIAGNOSIS — I110 Hypertensive heart disease with heart failure: Secondary | ICD-10-CM

## 2022-09-15 DIAGNOSIS — Z6841 Body Mass Index (BMI) 40.0 and over, adult: Secondary | ICD-10-CM

## 2022-09-15 NOTE — Patient Instructions (Signed)
Referral has been placed for social worker assistance and medication management for assistance with your medications.   Medication Instructions:  No changes at this time.   *If you need a refill on your cardiac medications before your next appointment, please call your pharmacy*   Lab Work: None  If you have labs (blood work) drawn today and your tests are completely normal, you will receive your results only by: MyChart Message (if you have MyChart) OR A paper copy in the mail If you have any lab test that is abnormal or we need to change your treatment, we will call you to review the results.   Testing/Procedures: None   Follow-Up: At [facility], you and your health needs are our priority.  As part of our continuing mission to provide you with exceptional heart care, we have created designated Provider Care Teams.  These Care Teams include your primary Cardiologist (physician) and Advanced Practice Providers (APPs -  Physician Assistants and Nurse Practitioners) who all work together to provide you with the care you need, when you need it.   Your next appointment:   6 month(s)  Provider:   Lorine Bears, MD or Eula Listen, PA-C

## 2022-09-16 ENCOUNTER — Telehealth (HOSPITAL_COMMUNITY): Payer: Self-pay | Admitting: Licensed Clinical Social Worker

## 2022-09-16 NOTE — Telephone Encounter (Signed)
H&V Care Navigation CSW Progress Note  Clinical Social Worker referred to talk to pt regarding food insecurity.  Pt reports it is difficult for her to afford food on top of medications etc- makes about $1,400/month doing full time work.   Pt states she was told in the past she wouldn't be able to get food stamps as she didn't have any children- CSW encouraged her to apply again as having children is not a requirement of getting food stamps and given pt income she would likely qualify for some form of benefit.  CSW provided with link to epass website to apply online as that is pt preferred method to apply.  Pt already provided with list of local food pantries- will plan to go to [employer] today to get food.  Pt also might qualify for Medicaid given income and lack of assets.  Pt agreeable to CSW reaching out to [employer] workers to complete phone application- referral sent.   SDOH Screenings   Food Insecurity: Food Insecurity Present (09/16/2022)  Transportation Needs: No Transportation Needs (09/16/2022)  Financial Resource Strain: Medium Risk (09/16/2022)  Physical Activity: Inactive (04/05/2017)  Social Connections: Moderately Isolated (04/05/2017)  Stress: Stress Concern Present (04/05/2017)  Tobacco Use: Low Risk  (09/15/2022)   Burna Sis, LCSW Clinical Social Worker [facility] Desk#: [phone] Cell#: [phone]

## 2022-09-16 NOTE — Addendum Note (Signed)
Addended by: Bryna Colander on: 09/16/2022 10:20 AM   Modules accepted: Orders

## 2022-09-27 ENCOUNTER — Telehealth: Payer: Self-pay | Admitting: *Deleted

## 2022-09-27 NOTE — Telephone Encounter (Signed)
Financial constraints with food insecurity: Referred to social work and medication management. In an effort to minimize financial burden, I elected to defer labs at this time. We will revisit this in follow-up.

## 2022-09-27 NOTE — Telephone Encounter (Signed)
-----   Message from Dalia Heading sent at 09/27/2022 12:05 PM EDT ----- Regarding: please advise RD referred this patient to social work. Please advise provider for referral.

## 2022-11-16 ENCOUNTER — Ambulatory Visit
Admission: EM | Admit: 2022-11-16 | Discharge: 2022-11-16 | Disposition: A | Discharge status: Home / Self Care | Payer: BC Managed Care – PPO | Attending: Family Medicine | Admitting: Family Medicine

## 2022-11-16 DIAGNOSIS — I509 Heart failure, unspecified: Secondary | ICD-10-CM

## 2022-11-16 DIAGNOSIS — M25572 Pain in left ankle and joints of left foot: Secondary | ICD-10-CM

## 2022-11-16 DIAGNOSIS — M25472 Effusion, left ankle: Secondary | ICD-10-CM

## 2022-11-16 DIAGNOSIS — R2242 Localized swelling, mass and lump, left lower limb: Secondary | ICD-10-CM | POA: Diagnosis not present

## 2022-11-16 MED ORDER — FUROSEMIDE 40 MG PO TABS: 40.0000 mg | ORAL_TABLET | Freq: Every day | ORAL | 0 refills | Status: DC

## 2022-11-16 MED ORDER — POTASSIUM CHLORIDE ER 10 MEQ PO TBCR: 10.0000 meq | EXTENDED_RELEASE_TABLET | Freq: Every day | ORAL | 0 refills | Status: DC

## 2022-11-16 NOTE — Discharge Instructions (Addendum)
Start Lasix 40 mg take daily for 5 days to reduce fluid retention involving your left lower leg.  Along with Lasix take 1 dose of potassium replacement daily for 5 days to prevent loss of potassium. Hold hydrochlorothiazide while taking Lasix.  Resume hydrochlorothiazide today after completing your Lasix treatment. If swelling in the left leg has not improved with Lasix treatment, I would like for you to follow-up with your primary care doctor or your cardiologist as this could be a symptom of heart disease and heart failure.

## 2022-11-16 NOTE — ED Provider Notes (Signed)
Mindy Myers    CSN: [id number] Arrival date & time: 11/16/22  1211      History   Chief Complaint Chief Complaint  Patient presents with   Ankle Pain    HPI Mindy Myers is a 62 y.o. female.    Ankle Pain   Past Medical History:  Diagnosis Date   BMI 40.0-44.9, adult (HCC) 05/24/2019   CHF (congestive heart failure) (HCC) 03/27/2017   Chronic diastolic CHF (congestive heart failure) (HCC)    a. TTE 1/19: EF of 50-55%, no RWMA, Gr1DD, mildly dilated left atrium, PASP could not be estimated   COVID-19 08/2018   HLD (hyperlipidemia)    HTN (hypertension) 04/05/2017   Hypertensive heart disease    a. TTE 1/19: EF 50-55%, no RWMA, Gr1DD, mildly dilated left atrium, PASP could not be estimated; b. MV 1/19: small defect of mild severity present in the apex location felt to be 2/2 breast attenuation, EF 45-54% felt to be 2/2 hypertensive heart disease. This was a normal study   Obesity    Strain of knee 12/04/2018    Patient Active Problem List   Diagnosis Date Noted   Chronic diastolic CHF (congestive heart failure) (HCC) 08/11/2021   HLD (hyperlipidemia) 08/11/2021   Hypertensive heart disease 08/11/2021   Obesity 08/11/2021   BMI 40.0-44.9, adult (HCC) 05/24/2019   Strain of knee 12/04/2018   COVID-19 08/2018   HTN (hypertension) 04/05/2017   CHF (congestive heart failure) (HCC) 03/27/2017    Past Surgical History:  Procedure Laterality Date   HAND SURGERY Left     OB History   No obstetric history on file.      Home Medications    Prior to Admission medications   Medication Sig Start Date End Date Taking? Authorizing Provider  furosemide (LASIX) 40 MG tablet Take 1 tablet (40 mg total) by mouth daily. 11/16/22  Yes Bing Neighbors, NP  potassium chloride (KLOR-CON) 10 MEQ tablet Take 1 tablet (10 mEq total) by mouth daily for 5 days. 11/16/22 11/21/22 Yes Bing Neighbors, NP  aspirin EC (ASPIRIN LOW DOSE) 81 MG tablet TAKE 1 TABLET (81 MG  TOTAL) BY MOUTH DAILY. SWALLOW WHOLE 11/24/21   Dunn, Raymon Mutton, PA-C  atorvastatin (LIPITOR) 40 MG tablet Take 1 tablet (40 mg total) by mouth daily. 02/19/22   Dunn, Raymon Mutton, PA-C  carvedilol (COREG) 12.5 MG tablet Take 1 tablet (12.5 mg total) by mouth 2 (two) times daily. 02/19/22 09/15/22  Sondra Barges, PA-C  hydrochlorothiazide (MICROZIDE) 12.5 MG capsule Take 1 capsule (12.5 mg total) by mouth daily. 02/19/22   Dunn, Raymon Mutton, PA-C  lisinopril (ZESTRIL) 40 MG tablet Take 1 tablet (40 mg total) by mouth daily. 02/19/22   Dunn, Raymon Mutton, PA-C  verapamil (CALAN-SR) 240 MG CR tablet Take 1 tablet (240 mg total) by mouth daily. 02/19/22   Sondra Barges, PA-C    Family History Family History  Problem Relation Age of Onset   Hypertension Mother    Hypertension Father    Cancer Father        Thyroid    Social History Social History   Tobacco Use   Smoking status: Never   Smokeless tobacco: Never  Vaping Use   Vaping status: Never Used  Substance Use Topics   Alcohol use: Yes    Comment: 1 glass of wine every 6 months   Drug use: No     Allergies   Amoxicillin   Review of Systems  Review of Systems Pertinent negatives listed in HPI   Physical Exam Triage Vital Signs ED Triage Vitals  Encounter Vitals Group     BP 11/16/22 1221 (!) 173/86     Systolic BP Percentile --      Diastolic BP Percentile --      Pulse Rate 11/16/22 1221 91     Resp 11/16/22 1221 18     Temp --      Temp src --      SpO2 11/16/22 1221 91 %     Weight --      Height --      Head Circumference --      Peak Flow --      Pain Score 11/16/22 1220 5     Pain Loc --      Pain Education --      Exclude from Growth Chart --    No data found.  Updated Vital Signs BP (!) 173/86 (BP Location: Left Arm)   Pulse 91   Resp 18   SpO2 91%   Visual Acuity Right Eye Distance:   Left Eye Distance:   Bilateral Distance:    Right Eye Near:   Left Eye Near:    Bilateral Near:     Physical Exam Vitals  reviewed.  Constitutional:      General: She is not in acute distress.    Appearance: She is obese.  HENT:     Head: Normocephalic and atraumatic.  Eyes:     Extraocular Movements: Extraocular movements intact.     Pupils: Pupils are equal, round, and reactive to light.  Cardiovascular:     Rate and Rhythm: Normal rate and regular rhythm.  Pulmonary:     Effort: Pulmonary effort is normal.     Breath sounds: Normal breath sounds. No rhonchi or rales.  Musculoskeletal:     Cervical back: Normal range of motion and neck supple.     Right lower leg: 1+ Edema present.     Left lower leg: 3+ Edema present.  Skin:    Capillary Refill: Capillary refill takes less than 2 seconds.  Neurological:     General: No focal deficit present.      UC Treatments / Results  Labs (all labs ordered are listed, but only abnormal results are displayed) Labs Reviewed  BASIC METABOLIC PANEL    EKG   Radiology No results found.  Procedures Procedures (including critical care time)  Medications Ordered in UC Medications - No data to display  Initial Impression / Assessment and Plan / UC Course  I have reviewed the triage vital signs and the nursing notes.  Pertinent labs & imaging results that were available during my care of the patient were reviewed by me and considered in my medical decision making (see chart for details).   Patient with left lower lower extremity swelling, discussed at length this could be a early sign of CHF exacerbation.  Patient advised that I will trial her on 5 days of moderate dose Lasix 40 mg daily for 5 days, with potassium 10 mEq once daily with each dose of Lasix to prevent potassium wasting.  Obtained a BMP check renal function and current electrolyte status. Unable to order BNPs at this location to evaluate if CHF is contributing to symptoms.  Patient is not exhibiting any shortness of breath and does not consistently weigh.  Strict instructions to follow-up  with primary care doctor and/or cardiologist if her symptoms do not readily  improve within the next few days.  Patient verbalized understanding and agreement with plan today.  Ankle pain Ace wrap applied to left lower ankle.  There is been no injury suspect pain is related to the significant swelling involving her left leg.  Will also hold HCTZ while taking Lasix over the next 5 days.  Resume HCTZ as prescribed the following day after completing last dose of Lasix. Final Clinical Impressions(s) / UC Diagnoses   Final diagnoses:  Localized swelling of left lower extremity  Congestive heart failure, unspecified HF chronicity, unspecified heart failure type [facility])     Discharge Instructions      Start Lasix 40 mg take daily for 5 days to reduce fluid retention involving your left lower leg.  Along with Lasix take 1 dose of potassium replacement daily for 5 days to prevent loss of potassium. Hold hydrochlorothiazide while taking Lasix.  Resume hydrochlorothiazide today after completing your Lasix treatment. If swelling in the left leg has not improved with Lasix treatment, I would like for you to follow-up with your primary care doctor or your cardiologist as this could be a symptom of heart disease and heart failure.   ED Prescriptions     Medication Sig Dispense Auth. Provider   furosemide (LASIX) 40 MG tablet Take 1 tablet (40 mg total) by mouth daily. 5 tablet Bing Neighbors, NP   potassium chloride (KLOR-CON) 10 MEQ tablet Take 1 tablet (10 mEq total) by mouth daily for 5 days. 5 tablet Bing Neighbors, NP      PDMP not reviewed this encounter.   Bing Neighbors, NP 11/16/22 1332

## 2022-11-16 NOTE — ED Triage Notes (Signed)
Patient presents to UC for left ankle pain x 5 days. She reports standing on her feet for a long time at work, denies injury.   Not taking any OTC meds for pain.

## 2022-11-17 ENCOUNTER — Ambulatory Visit
Admission: RE | Admit: 2022-11-17 | Discharge: 2022-11-17 | Disposition: A | Discharge status: Home / Self Care | Payer: BC Managed Care – PPO | Source: Ambulatory Visit | Attending: Physician Assistant | Admitting: Physician Assistant

## 2022-11-17 ENCOUNTER — Other Ambulatory Visit: Payer: Self-pay | Admitting: Emergency Medicine

## 2022-11-17 DIAGNOSIS — M79604 Pain in right leg: Secondary | ICD-10-CM | POA: Diagnosis not present

## 2022-11-17 DIAGNOSIS — R609 Edema, unspecified: Secondary | ICD-10-CM | POA: Diagnosis not present

## 2022-11-17 DIAGNOSIS — M79605 Pain in left leg: Secondary | ICD-10-CM | POA: Diagnosis not present

## 2022-11-17 LAB — BASIC METABOLIC PANEL
BUN/Creatinine Ratio: 19 (ref 12–28)
BUN: 20 mg/dL (ref 8–27)
CO2: 25 mmol/L (ref 20–29)
Calcium: 8.9 mg/dL (ref 8.7–10.3)
Chloride: 103 mmol/L (ref 96–106)
Creatinine, Ser: 1.03 mg/dL — ABNORMAL HIGH (ref 0.57–1.00)
Glucose: 81 mg/dL (ref 70–99)
Potassium: 4.4 mmol/L (ref 3.5–5.2)
Sodium: 143 mmol/L (ref 134–144)
eGFR: 61 mL/min/{1.73_m2} (ref >59)

## 2022-11-17 NOTE — Telephone Encounter (Signed)
Spoke with patient on the phone and advised that I was not trying to scare her and I do not believe she is having a heart attack but I wanted to rule out the possibility of a blood clot. Advised patient that pain is more than likely due to the swelling and to keep her legs elevated. Patient would like for ryan to review medication recommendations and to look at labs from urgent care. Advised patient that I will reach out to her once Alycia Rossetti has reviewed the message.    Urgent care medication d/c instructions below  Start Lasix 40 mg take daily for 5 days to reduce fluid retention involving your left lower leg. Along with Lasix take 1 dose of potassium replacement daily for 5 days to prevent loss of potassium. Hold hydrochlorothiazide while taking Lasix. Resume hydrochlorothiazide today after completing your Lasix treatment.

## 2022-11-17 NOTE — Telephone Encounter (Signed)
Patient advised of providers message and verbalized understanding. Patient has appt at [facility] for stat venous U/S today at 1:00 PM. Patient given the address and phone number to [facility] and advised to call if she has any questions. Patient informed that we will call her once the results are received.

## 2022-11-18 ENCOUNTER — Other Ambulatory Visit: Payer: Self-pay | Admitting: Physician Assistant

## 2022-11-24 NOTE — Progress Notes (Unsigned)
Cardiology Office Note    Date:  11/25/2022   ID:  Mindy Myers, DOB [birth year], MRN [id number]  PCP:  [facility]  Cardiologist:  Lorine Bears, MD  Electrophysiologist:  None   Chief Complaint: Follow up  History of Present Illness:   Mindy Myers is a 62 y.o. female with history of hypertensive heart disease with chronic diastolic CHF, HLD, osteoarthritis affecting the bilateral hands, COVID-19 infection in 08/2018, and obesity who presents for follow up of recent urgent care visit.  She was previously evaluated in 2013 for atypical chest pain, ruled out and underwent nuclear stress testing that was without evidence of ischemia. She reported being diagnosed with HTN years ago and had reportedly been managed with 3 antihypertensives. She had been out of these medications for several months secondary to finances prior to her admission in early 03/2017. She was admitted to [facility] in 03/2017 for chest pain and accelerated HTN with an initial BP of 202/86. Cardiac enzymes negative, BNP 83, EKG with sinus rhythm with LVH and repolarization abnormalities. Echo 03/28/17 showed EF of 50-55%, no RWMA, Gr1DD, mildly dilated left atrium, PASP could not be estimated. Lexiscan MPI in 03/2017 showed a small defect of mild severity present in the apex location felt to be secondary to breast attenuation, EF 45-54%, felt to be in the setting of hypertensive heart disease. This was a normal study. She was started on Lipitor, Coreg, irbesartan, and Lasix. Since her admission, she has done well and has been keeping a detailed food diary with strict sodium restriction noted. She was diagnosed with COVID-19 in 08/2018 and did not require hospital admission. Following this diagnosis she noted exertional dyspnea with subsequent D-dimer being elevated. CTA of the chest in 09/2018 was negative for PE. Lower extremity ultrasound was negative for DVT bilaterally. CRP and sed rate were normal. Echo in 10/2018 showed an  EF of 50-55%, diastolic dysfunction, mildly dilated LA, and mild mitral regurgitation.  She was last seen in the office in 08/2022 and remained without symptoms of angina or cardiac decompensation.  She was under increased stress due to financial constraints.  In this setting, she was finding it difficult to obtain food low in sodium.  She was referred to our social work team, who have provided assistance.  She was working with her pharmacy to try and obtain a reduced cost of medication.  Her weight was down one pound when compared to revisit in 02/2022.  Was evaluated at a local urgent care on 11/16/2022 after noticing pain and swelling in the left ankle and foot without known trauma.  Upon arriving there, there was concern for lower extremity swelling with the left being greater than the right.  The patient was without symptoms of angina or cardiac decompensation.  It was advised that she hold HCTZ and take furosemide for 5 days.  Subsequent lower extremity ultrasound was negative for DVT bilaterally on 11/17/2022.  She comes in today and is without symptoms of angina or cardiac decompensation.  Throughout all of the above, she has been without symptoms of dyspnea, orthopnea, PND, or early satiety.  She did note some improvement in her left pedal edema while on furosemide.  She also notes some improvement in the swelling with leg elevation and wearing an Ace wrap.  Swelling extends up to the mid shin and is slightly more pronounced in the left lower extremity.  Blood pressure remains well-controlled at home.  She continues to adhere to a low-sodium diet.  Her initial concern and presenting to the urgent care was for evaluation of possible injury to the left ankle.  She otherwise feels well.   Labs independently reviewed: 10/2022 - BUN 20, serum creatinine 1.03, potassium 4.4 07/2021 - TSH normal, albumin 4.0, AST/ALT normal, direct LDL 95, TC 163, TG 155, HDL 41 12/2017 - Hgb 13.0, PLT 391 03/2017 - A1c  5.4  Past Medical History:  Diagnosis Date   BMI 40.0-44.9, adult (HCC) 05/24/2019   CHF (congestive heart failure) (HCC) 03/27/2017   Chronic diastolic CHF (congestive heart failure) (HCC)    a. TTE 1/19: EF of 50-55%, no RWMA, Gr1DD, mildly dilated left atrium, PASP could not be estimated   COVID-19 08/2018   HLD (hyperlipidemia)    HTN (hypertension) 04/05/2017   Hypertensive heart disease    a. TTE 1/19: EF 50-55%, no RWMA, Gr1DD, mildly dilated left atrium, PASP could not be estimated; b. MV 1/19: small defect of mild severity present in the apex location felt to be 2/2 breast attenuation, EF 45-54% felt to be 2/2 hypertensive heart disease. This was a normal study   Obesity    Strain of knee 12/04/2018    Past Surgical History:  Procedure Laterality Date   HAND SURGERY Left     Current Medications: Current Meds  Medication Sig   aspirin EC (ASPIRIN LOW DOSE) 81 MG tablet TAKE 1 TABLET (81 MG TOTAL) BY MOUTH DAILY. SWALLOW WHOLE   atorvastatin (LIPITOR) 40 MG tablet Take 1 tablet (40 mg total) by mouth daily.   hydrochlorothiazide (MICROZIDE) 12.5 MG capsule Take 1 capsule (12.5 mg total) by mouth daily.   lisinopril (ZESTRIL) 40 MG tablet Take 1 tablet (40 mg total) by mouth daily.   [DISCONTINUED] carvedilol (COREG) 12.5 MG tablet Take 1 tablet (12.5 mg total) by mouth 2 (two) times daily.   [DISCONTINUED] verapamil (CALAN-SR) 240 MG CR tablet Take 1 tablet (240 mg total) by mouth daily.    Allergies:   Amoxicillin   Social History   Socioeconomic History   Marital status: Single    Spouse name: Not on file   Number of children: 2   Years of education: 12   Highest education level: 12th grade  Occupational History   Not on file  Tobacco Use   Smoking status: Never   Smokeless tobacco: Never  Vaping Use   Vaping status: Never Used  Substance and Sexual Activity   Alcohol use: Yes    Comment: 1 glass of wine every 6 months   Drug use: No   Sexual activity: Never   Other Topics Concern   Not on file  Social History Narrative   Not on file   Social Determinants of Health   Financial Resource Strain: Medium Risk (09/16/2022)   Overall Financial Resource Strain (CARDIA)    Difficulty of Paying Living Expenses: Somewhat hard  Food Insecurity: Food Insecurity Present (09/16/2022)   Hunger Vital Sign    Worried About Running Out of Food in the Last Year: Sometimes true    Ran Out of Food in the Last Year: Sometimes true  Transportation Needs: No Transportation Needs (09/16/2022)   PRAPARE - Administrator, Civil Service (Medical): No    Lack of Transportation (Non-Medical): No  Physical Activity: Inactive (04/05/2017)   Exercise Vital Sign    Days of Exercise per Week: 0 days    Minutes of Exercise per Session: 0 min  Stress: Stress Concern Present (04/05/2017)   [employer] of  Occupational Health - Occupational Stress Questionnaire    Feeling of Stress : To some extent  Social Connections: Moderately Isolated (04/05/2017)   Social Connection and Isolation Panel [NHANES]    Frequency of Communication with Friends and Family: More than three times a week    Frequency of Social Gatherings with Friends and Family: Never    Attends Religious Services: Never    Database administrator or Organizations: No    Attends Engineer, structural: Never    Marital Status: Divorced     Family History:  The patient's family history includes Cancer in her father; Hypertension in her father and mother.  ROS:   12-point review of systems is negative unless otherwise noted in the HPI.   EKGs/Labs/Other Studies Reviewed:    Studies reviewed were summarized above. The additional studies were reviewed today:  Lexiscan MPI 03/2017: There was no ST segment deviation noted during stress. No T wave inversion was noted during stress. Defect 1: There is a small defect of mild severity present in the apex location. This is likely due to breast  attenuation The study is normal. This is a low risk study. The left ventricular ejection fraction is mildly decreased (45-54%). Mildly reduced EF likely due to hypertensive heart disease __________   2D echo 03/2017: - Left ventricle: The cavity size was mildly dilated. There was    moderate concentric hypertrophy. Systolic function was normal.    The estimated ejection fraction was in the range of 50% to 55%.    Wall motion was normal; there were no regional wall motion    abnormalities. Doppler parameters are consistent with abnormal    left ventricular relaxation (grade 1 diastolic dysfunction).  - Left atrium: The atrium was mildly dilated.  - Pulmonary arteries: Systolic pressure could not be accurately    estimated. __________   2D echo 10/2018: 1. The left ventricle has low normal systolic function, with an ejection fraction of 50-55%. The cavity size was normal. Left ventricular diastolic Doppler parameters are consistent with impaired relaxation.  2. The right ventricle has normal systolic function. The cavity was normal. There is no increase in right ventricular wall thickness. Unable to estimate RVSP.  3. Left atrial size was mildly dilated.   EKG:  EKG is not ordered today.    Recent Labs: 11/16/2022: BUN 20; Creatinine, Ser 1.03; Potassium 4.4; Sodium 143  Recent Lipid Panel    Component Value Date/Time   CHOL 163 08/12/2021 1429   CHOL 146 08/27/2020 1403   TRIG 155 (H) 08/12/2021 1429   HDL 41 08/12/2021 1429   HDL 38 (L) 08/27/2020 1403   CHOLHDL 4.0 08/12/2021 1429   VLDL 31 08/12/2021 1429   LDLCALC 91 08/12/2021 1429   LDLCALC 84 08/27/2020 1403   LDLDIRECT 95.0 08/12/2021 1429    PHYSICAL EXAM:    VS:  BP (!) 140/80 (BP Location: Left Arm, Patient Position: Sitting, Cuff Size: Large)   Pulse (!) 57   Ht 5\' 9"  (1.753 m)   Wt 285 lb 4 oz (129.4 kg)   SpO2 97%   BMI 42.12 kg/m   BMI: Body mass index is 42.12 kg/m.  Physical Exam Vitals reviewed.   Constitutional:      Appearance: She is well-developed.  HENT:     Head: Normocephalic and atraumatic.  Eyes:     General:        Right eye: No discharge.        Left eye: No  discharge.  Neck:     Comments: JVD unable to be assessed. Cardiovascular:     Rate and Rhythm: Normal rate and regular rhythm.     Heart sounds: Normal heart sounds, S1 normal and S2 normal. Heart sounds not distant. No midsystolic click and no opening snap. No murmur heard.    No friction rub.  Pulmonary:     Effort: Pulmonary effort is normal. No respiratory distress.     Breath sounds: Normal breath sounds. No decreased breath sounds, wheezing or rales.  Chest:     Chest wall: No tenderness.  Abdominal:     General: There is no distension.  Musculoskeletal:     Cervical back: Normal range of motion.     Comments: Trace left lower extremity pitting edema to the mid shin with trivial pitting edema involving the right lower extremity to the mid shin.  Left ankle wrapped with Ace wrap.  Skin:    General: Skin is warm and dry.     Nails: There is no clubbing.  Neurological:     Mental Status: She is alert and oriented to person, place, and time.  Psychiatric:        Speech: Speech normal.        Behavior: Behavior normal.        Thought Content: Thought content normal.        Judgment: Judgment normal.     Wt Readings from Last 3 Encounters:  11/25/22 285 lb 4 oz (129.4 kg)  09/15/22 279 lb 6.4 oz (126.7 kg)  02/19/22 280 lb 8 oz (127.2 kg)     ASSESSMENT & PLAN:   Lower extremity swelling with the left being slightly more pronounced than the right: No known injury or trauma.  Lower extremity ultrasound negative for DVT bilaterally.  It would be unlikely for lower extremity swelling to present this way from a cardiac etiology.  However, obtain echo to evaluate for new cardiomyopathy.  Stop verapamil in case this is contributing to peripheral edema.  May need to consider CT of the abdomen/pelvis to  evaluate for compressive process, though would expect this would lead to swelling of the entire lower extremity.  Hypertensive heart disease: Is mildly elevated in the office today, though she is somewhat stressed regarding the above.  Blood pressure has been well-controlled at home.  Given we are discontinuing verapamil as outlined above, to compensate for this we will titrate carvedilol to 25 mg twice daily.  She remains on lisinopril 40 mg and HCTZ 12.5 mg.  Continue low-sodium diet.  Diastolic dysfunction: Update echo as outlined above.  HLD: LDL 95 in 07/2021 with normal AST/ALT at that time.  She remains on atorvastatin 40 mg.  Obesity: Weight loss is encouraged through heart healthy diet and regular exercise.    Disposition: F/u with Dr. Kirke Corin or an APP in 2 months.   Medication Adjustments/Labs and Tests Ordered: Current medicines are reviewed at length with the patient today.  Concerns regarding medicines are outlined above. Medication changes, Labs and Tests ordered today are summarized above and listed in the Patient Instructions accessible in Encounters.   Signed, Eula Listen, PA-C 11/25/2022 4:29 PM     [facility] [place] [address] [place], [place] [address] [phone]

## 2022-11-25 ENCOUNTER — Encounter: Payer: Self-pay | Admitting: Physician Assistant

## 2022-11-25 ENCOUNTER — Ambulatory Visit: Payer: BC Managed Care – PPO | Attending: Physician Assistant | Admitting: Physician Assistant

## 2022-11-25 VITALS — BP 140/80 | HR 57 | Ht 69.0 in | Wt 285.2 lb

## 2022-11-25 DIAGNOSIS — Z6841 Body Mass Index (BMI) 40.0 and over, adult: Secondary | ICD-10-CM

## 2022-11-25 DIAGNOSIS — I5189 Other ill-defined heart diseases: Secondary | ICD-10-CM

## 2022-11-25 DIAGNOSIS — M7989 Other specified soft tissue disorders: Secondary | ICD-10-CM | POA: Diagnosis not present

## 2022-11-25 DIAGNOSIS — E785 Hyperlipidemia, unspecified: Secondary | ICD-10-CM | POA: Diagnosis not present

## 2022-11-25 DIAGNOSIS — I119 Hypertensive heart disease without heart failure: Secondary | ICD-10-CM | POA: Diagnosis not present

## 2022-11-25 MED ORDER — CARVEDILOL 25 MG PO TABS: 25.0000 mg | ORAL_TABLET | Freq: Two times a day (BID) | ORAL | 0 refills | Status: DC

## 2022-11-25 NOTE — Patient Instructions (Signed)
Medication Instructions:  Your physician recommends the following medication changes.  STOP TAKING: Verapamil  INCREASE: Carvedilol to 25 mg twice daily  *If you need a refill on your cardiac medications before your next appointment, please call your pharmacy*   Lab Work: None ordered If you have labs (blood work) drawn today and your tests are completely normal, you will receive your results only by: MyChart Message (if you have MyChart) OR A paper copy in the mail If you have any lab test that is abnormal or we need to change your treatment, we will call you to review the results.   Testing/Procedures: Your physician has requested that you have an echocardiogram. Echocardiography is a painless test that uses sound waves to create images of your heart. It provides your doctor with information about the size and shape of your heart and how well your heart's chambers and valves are working.   You may receive an ultrasound enhancing agent through an IV if needed to better visualize your heart during the echo. This procedure takes approximately one hour.  There are no restrictions for this procedure.  This will take place at [address] [address], [place] [address]    Follow-Up: At [facility], you and your health needs are our priority.  As part of our continuing mission to provide you with exceptional heart care, we have created designated Provider Care Teams.  These Care Teams include your primary Cardiologist (physician) and Advanced Practice Providers (APPs -  Physician Assistants and Nurse Practitioners) who all work together to provide you with the care you need, when you need it.  We recommend signing up for the patient portal called "MyChart".  Sign up information is provided on this After Visit Summary.  MyChart is used to connect with patients for Virtual Visits (Telemedicine).  Patients are able to view lab/test results, encounter notes,  upcoming appointments, etc.  Non-urgent messages can be sent to your provider as well.   To learn more about what you can do with MyChart, go to [email or web address].    Your next appointment:   2 month(s)  Provider:   You may see Lorine Bears, MD or one of the following Advanced Practice Providers on your designated Care Team:   Eula Listen, [place]

## 2022-12-02 NOTE — Telephone Encounter (Signed)
Please see MyChart message.  I have increased her HCTZ to 25 mg daily.  Please place future orders for patient to come to the lab for a BMP in 7 to 10 days.

## 2022-12-13 ENCOUNTER — Telehealth: Payer: Self-pay | Admitting: Emergency Medicine

## 2022-12-13 ENCOUNTER — Other Ambulatory Visit: Payer: Self-pay | Admitting: Emergency Medicine

## 2022-12-13 DIAGNOSIS — Z79899 Other long term (current) drug therapy: Secondary | ICD-10-CM

## 2022-12-13 MED ORDER — HYDROCHLOROTHIAZIDE 25 MG PO TABS: 25.0000 mg | ORAL_TABLET | Freq: Every day | ORAL | 3 refills | Status: DC

## 2022-12-13 NOTE — Telephone Encounter (Signed)
The patient has been notified of the pending repeat blood work and reports she will come in tomorrow for this. All questions were answered.  Pt reminded of upcoming Echo appt on the 30th.    Pt med list changed to reflect pt taking hydrochlorothiazide 25 mg daily

## 2022-12-13 NOTE — Telephone Encounter (Signed)
Entered in error

## 2022-12-13 NOTE — Addendum Note (Signed)
Addended by: Ursula Alert on: 12/13/2022 04:56 PM   Modules accepted: Orders

## 2022-12-14 DIAGNOSIS — Z79899 Other long term (current) drug therapy: Secondary | ICD-10-CM | POA: Diagnosis not present

## 2022-12-15 LAB — BASIC METABOLIC PANEL
BUN/Creatinine Ratio: 17 (ref 12–28)
BUN: 15 mg/dL (ref 8–27)
CO2: 26 mmol/L (ref 20–29)
Calcium: 8.9 mg/dL (ref 8.7–10.3)
Chloride: 99 mmol/L (ref 96–106)
Creatinine, Ser: 0.9 mg/dL (ref 0.57–1.00)
Glucose: 86 mg/dL (ref 70–99)
Potassium: 4.5 mmol/L (ref 3.5–5.2)
Sodium: 140 mmol/L (ref 134–144)
eGFR: 72 mL/min/{1.73_m2} (ref >59)

## 2022-12-16 ENCOUNTER — Other Ambulatory Visit: Payer: Self-pay | Admitting: Physician Assistant

## 2022-12-17 MED ORDER — AMLODIPINE BESYLATE 5 MG PO TABS: 5.0000 mg | ORAL_TABLET | Freq: Every day | ORAL | 3 refills | Status: DC

## 2022-12-20 ENCOUNTER — Ambulatory Visit: Payer: BC Managed Care – PPO | Attending: Physician Assistant

## 2022-12-20 DIAGNOSIS — I119 Hypertensive heart disease without heart failure: Secondary | ICD-10-CM | POA: Diagnosis not present

## 2022-12-20 LAB — ECHOCARDIOGRAM COMPLETE
Area-P 1/2: 3.12 cm2
Est EF: 55
S' Lateral: 3.1 cm

## 2023-01-17 ENCOUNTER — Other Ambulatory Visit: Payer: Self-pay | Admitting: Physician Assistant

## 2023-01-21 NOTE — Progress Notes (Unsigned)
Cardiology Office Note    Date:  01/25/2023   ID:  Mindy Myers, DOB 05-11-1960, MRN [id number]  PCP:  [facility]  Cardiologist:  Lorine Bears, MD  Electrophysiologist:  None   Chief Complaint: Follow-up  History of Present Illness:   Mindy Myers is a 62 y.o. female with history of hypertensive heart disease with chronic diastolic CHF, HLD, osteoarthritis affecting the bilateral hands, COVID-19 infection in 08/2018, and obesity who presents for follow up of echo.   She was previously evaluated in 2013 for atypical chest pain, ruled out and underwent nuclear stress testing that was without evidence of ischemia. She reported being diagnosed with HTN years ago and had reportedly been managed with 3 antihypertensives. She had been out of these medications for several months secondary to finances prior to her admission in early 03/2017. She was admitted to [facility] in 03/2017 for chest pain and accelerated HTN with an initial BP of 202/86. Cardiac enzymes negative, BNP 83, EKG with sinus rhythm with LVH and repolarization abnormalities. Echo 03/28/17 showed EF of 50-55%, no RWMA, Gr1DD, mildly dilated left atrium, PASP could not be estimated. Lexiscan MPI in 03/2017 showed a small defect of mild severity present in the apex location felt to be secondary to breast attenuation, EF 45-54%, felt to be in the setting of hypertensive heart disease. This was a normal study. She was started on Lipitor, Coreg, irbesartan, and Lasix. Since her admission, she has done well and has been keeping a detailed food diary with strict sodium restriction noted. She was diagnosed with COVID-19 in 08/2018 and did not require hospital admission. Following this diagnosis she noted exertional dyspnea with subsequent D-dimer being elevated. CTA of the chest in 09/2018 was negative for PE. Lower extremity ultrasound was negative for DVT bilaterally. CRP and sed rate were normal. Echo in 10/2018 showed an EF of 50-55%,  diastolic dysfunction, mildly dilated LA, and mild mitral regurgitation.  She was seen in the office in 08/2022 and was under increased stress due to financial constraints.  In this setting, she was finding it difficult to obtain food low in sodium.  She was referred to our social work team, who have provided assistance.  She was working with her pharmacy to try and obtain a reduced cost of medication.     She was evaluated at a local urgent care on 11/16/2022 after noticing pain and swelling in the left ankle and foot without known trauma.  Upon arriving there, there was concern for lower extremity swelling with the left being greater than the right.  The patient was without symptoms of angina or cardiac decompensation.  It was advised that she hold HCTZ and take furosemide for 5 days.  Subsequent lower extremity ultrasound was negative for DVT bilaterally on 11/17/2022.  She was last seen in the office on 11/25/2022 and did notice some improvement in lower extremity swelling with Ace wrap's and while on furosemide.  She was without symptoms of angina or cardiac decompensation.  In the setting of lower extremity swelling, we discontinued her verapamil and titrated carvedilol to 25 mg twice daily with continuation of lisinopril 40 mg and HCTZ 12.5 mg, with subsequent titration of hydrochlorothiazide to 25 mg.  She was restarted on amlodipine 5 mg in late 11/2022.  Echo in 11/2022 showed an EF of 55%, no regional wall motion abnormalities, normal LV diastolic function parameters, normal RV systolic function and ventricular cavity size, trivial mitral regurgitation with degenerative mitral valve, tricuspid  aortic valve, and an estimated right atrial pressure of 3 mmHg.  She comes in doing well from a cardiac perspective and is without symptoms of angina or cardiac decompensation.  No significant dyspnea, palpitations, dizziness, presyncope, or syncope.  No further lower extremity swelling.  Wearing compression socks.   No progressive orthopnea.  Continues to monitor diet and sodium intake very closely.  Blood pressure overall has been reasonably controlled in the 1 teens to 130s systolic.  Weight down 4 pounds by our scale when compared to revisit in 11/2022.   Labs independently reviewed: 11/2022 - BUN 15, serum creatinine 0.9, potassium 4.5 07/2021 - TSH normal, albumin 4.0, AST/ALT normal, direct LDL 95, TC 163, TG 155, HDL 41 12/2017 - Hgb 13.0, PLT 391 03/2017 - A1c 5.4  Past Medical History:  Diagnosis Date   BMI 40.0-44.9, adult (HCC) 05/24/2019   CHF (congestive heart failure) (HCC) 03/27/2017   Chronic diastolic CHF (congestive heart failure) (HCC)    a. TTE 1/19: EF of 50-55%, no RWMA, Gr1DD, mildly dilated left atrium, PASP could not be estimated   COVID-19 08/2018   HLD (hyperlipidemia)    HTN (hypertension) 04/05/2017   Hypertensive heart disease    a. TTE 1/19: EF 50-55%, no RWMA, Gr1DD, mildly dilated left atrium, PASP could not be estimated; b. MV 1/19: small defect of mild severity present in the apex location felt to be 2/2 breast attenuation, EF 45-54% felt to be 2/2 hypertensive heart disease. This was a normal study   Obesity    Strain of knee 12/04/2018    Past Surgical History:  Procedure Laterality Date   HAND SURGERY Left     Current Medications: Current Meds  Medication Sig   aspirin EC (ASPIRIN LOW DOSE) 81 MG tablet TAKE 1 TABLET (81 MG TOTAL) BY MOUTH DAILY. SWALLOW WHOLE   atorvastatin (LIPITOR) 40 MG tablet Take 1 tablet (40 mg total) by mouth daily.   hydrochlorothiazide (HYDRODIURIL) 25 MG tablet Take 1 tablet (25 mg total) by mouth daily.   lisinopril (ZESTRIL) 40 MG tablet TAKE 1 TABLET BY MOUTH EVERY DAY   [DISCONTINUED] amLODipine (NORVASC) 5 MG tablet Take 1 tablet (5 mg total) by mouth daily.   [DISCONTINUED] carvedilol (COREG) 25 MG tablet TAKE 1 TABLET BY MOUTH TWICE A DAY    Allergies:   Amoxicillin   Social History   Socioeconomic History   Marital  status: Single    Spouse name: Not on file   Number of children: 2   Years of education: 12   Highest education level: 12th grade  Occupational History   Not on file  Tobacco Use   Smoking status: Never   Smokeless tobacco: Never  Vaping Use   Vaping status: Never Used  Substance and Sexual Activity   Alcohol use: Yes    Comment: 1 glass of wine every 6 months   Drug use: No   Sexual activity: Never  Other Topics Concern   Not on file  Social History Narrative   Not on file   Social Determinants of Health   Financial Resource Strain: Medium Risk (09/16/2022)   Overall Financial Resource Strain (CARDIA)    Difficulty of Paying Living Expenses: Somewhat hard  Food Insecurity: Food Insecurity Present (09/16/2022)   Hunger Vital Sign    Worried About Running Out of Food in the Last Year: Sometimes true    Ran Out of Food in the Last Year: Sometimes true  Transportation Needs: No Transportation Needs (09/16/2022)  PRAPARE - Administrator, Civil Service (Medical): No    Lack of Transportation (Non-Medical): No  Physical Activity: Inactive (04/05/2017)   Exercise Vital Sign    Days of Exercise per Week: 0 days    Minutes of Exercise per Session: 0 min  Stress: Stress Concern Present (04/05/2017)   [employer] of Occupational Health - Occupational Stress Questionnaire    Feeling of Stress : To some extent  Social Connections: Moderately Isolated (04/05/2017)   Social Connection and Isolation Panel [NHANES]    Frequency of Communication with Friends and Family: More than three times a week    Frequency of Social Gatherings with Friends and Family: Never    Attends Religious Services: Never    Database administrator or Organizations: No    Attends Engineer, structural: Never    Marital Status: Divorced     Family History:  The patient's family history includes Cancer in her father; Hypertension in her father and mother.  ROS:   12-point review of  systems is negative unless otherwise noted in the HPI.   EKGs/Labs/Other Studies Reviewed:    Studies reviewed were summarized above. The additional studies were reviewed today:  Lexiscan MPI 03/2017: There was no ST segment deviation noted during stress. No T wave inversion was noted during stress. Defect 1: There is a small defect of mild severity present in the apex location. This is likely due to breast attenuation The study is normal. This is a low risk study. The left ventricular ejection fraction is mildly decreased (45-54%). Mildly reduced EF likely due to hypertensive heart disease __________   2D echo 03/2017: - Left ventricle: The cavity size was mildly dilated. There was    moderate concentric hypertrophy. Systolic function was normal.    The estimated ejection fraction was in the range of 50% to 55%.    Wall motion was normal; there were no regional wall motion    abnormalities. Doppler parameters are consistent with abnormal    left ventricular relaxation (grade 1 diastolic dysfunction).  - Left atrium: The atrium was mildly dilated.  - Pulmonary arteries: Systolic pressure could not be accurately    estimated. __________   2D echo 10/2018: 1. The left ventricle has low normal systolic function, with an ejection fraction of 50-55%. The cavity size was normal. Left ventricular diastolic Doppler parameters are consistent with impaired relaxation.  2. The right ventricle has normal systolic function. The cavity was normal. There is no increase in right ventricular wall thickness. Unable to estimate RVSP.  3. Left atrial size was mildly dilated. __________  2D echo 12/20/2022: 1. Left ventricular ejection fraction, by estimation, is 55%. The left  ventricle has normal function. The left ventricle has no regional wall  motion abnormalities. Left ventricular diastolic parameters were normal.  The average left ventricular global  longitudinal strain is -19.7 %. The global  longitudinal strain is normal.   2. Right ventricular systolic function is normal. The right ventricular  size is normal.   3. The mitral valve is degenerative. Trivial mitral valve regurgitation.   4. The aortic valve is tricuspid. Aortic valve regurgitation is not  visualized.   5. The inferior vena cava is normal in size with greater than 50%  respiratory variability, suggesting right atrial pressure of 3 mmHg.    EKG:  EKG is not ordered today.    Recent Labs: 12/14/2022: BUN 15; Creatinine, Ser 0.90; Potassium 4.5; Sodium 140  Recent Lipid Panel  Component Value Date/Time   CHOL 163 08/12/2021 1429   CHOL 146 08/27/2020 1403   TRIG 155 (H) 08/12/2021 1429   HDL 41 08/12/2021 1429   HDL 38 (L) 08/27/2020 1403   CHOLHDL 4.0 08/12/2021 1429   VLDL 31 08/12/2021 1429   LDLCALC 91 08/12/2021 1429   LDLCALC 84 08/27/2020 1403   LDLDIRECT 95.0 08/12/2021 1429    PHYSICAL EXAM:    VS:  BP 122/80 (BP Location: Left Arm, Patient Position: Sitting, Cuff Size: Normal)   Pulse 70   Ht 5\' 9"  (1.753 m)   Wt 281 lb 9.6 oz (127.7 kg)   SpO2 96%   BMI 41.59 kg/m   BMI: Body mass index is 41.59 kg/m.  Physical Exam Vitals reviewed.  Constitutional:      Appearance: She is well-developed.  HENT:     Head: Normocephalic and atraumatic.  Eyes:     General:        Right eye: No discharge.        Left eye: No discharge.  Cardiovascular:     Rate and Rhythm: Normal rate and regular rhythm.     Heart sounds: Normal heart sounds, S1 normal and S2 normal. Heart sounds not distant. No midsystolic click and no opening snap. No murmur heard.    No friction rub.  Pulmonary:     Effort: Pulmonary effort is normal. No respiratory distress.     Breath sounds: Normal breath sounds. No decreased breath sounds, wheezing, rhonchi or rales.  Chest:     Chest wall: No tenderness.  Abdominal:     General: There is no distension.  Musculoskeletal:     Cervical back: Normal range of motion.      Right lower leg: No edema.     Left lower leg: No edema.  Skin:    General: Skin is warm and dry.     Nails: There is no clubbing.  Neurological:     Mental Status: She is alert and oriented to person, place, and time.  Psychiatric:        Speech: Speech normal.        Behavior: Behavior normal.        Thought Content: Thought content normal.        Judgment: Judgment normal.     Wt Readings from Last 3 Encounters:  01/25/23 281 lb 9.6 oz (127.7 kg)  11/25/22 285 lb 4 oz (129.4 kg)  09/15/22 279 lb 6.4 oz (126.7 kg)     ASSESSMENT & PLAN:   Hypertensive heart disease: Blood pressure is well-controlled in the office today.  Titrate amlodipine to 7.5 mg daily with close monitoring for lower extremity swelling.  Continue carvedilol 25 mg twice daily, HCTZ 25 mg, and lisinopril 40 mg.  Recent labs stable.  Continue low-sodium diet.  Diastolic dysfunction: Normal diastolic function noted on recent echo.  Continue optimal blood pressure control as outlined above.  HLD: LDL 95 in 07/2021 with normal AST/ALT at that time.  She remains on atorvastatin 40 mg.  Obesity: Continued weight loss is encouraged through heart healthy diet and regular exercise.    Disposition: F/u with Dr. Kirke Corin or an APP in 6 months.   Medication Adjustments/Labs and Tests Ordered: Current medicines are reviewed at length with the patient today.  Concerns regarding medicines are outlined above. Medication changes, Labs and Tests ordered today are summarized above and listed in the Patient Instructions accessible in Encounters.   SignedEula Listen, PA-C 01/25/2023 4:29  PM     [facility] [address] [place], [place] [address] [phone]

## 2023-01-25 ENCOUNTER — Ambulatory Visit: Payer: BC Managed Care – PPO | Attending: Physician Assistant | Admitting: Physician Assistant

## 2023-01-25 ENCOUNTER — Encounter: Payer: Self-pay | Admitting: Physician Assistant

## 2023-01-25 VITALS — BP 122/80 | HR 70 | Ht 69.0 in | Wt 281.6 lb

## 2023-01-25 DIAGNOSIS — E66813 Obesity, class 3: Secondary | ICD-10-CM

## 2023-01-25 DIAGNOSIS — E785 Hyperlipidemia, unspecified: Secondary | ICD-10-CM | POA: Diagnosis not present

## 2023-01-25 DIAGNOSIS — I5189 Other ill-defined heart diseases: Secondary | ICD-10-CM

## 2023-01-25 DIAGNOSIS — I119 Hypertensive heart disease without heart failure: Secondary | ICD-10-CM | POA: Diagnosis not present

## 2023-01-25 DIAGNOSIS — Z6841 Body Mass Index (BMI) 40.0 and over, adult: Secondary | ICD-10-CM

## 2023-01-25 MED ORDER — AMLODIPINE BESYLATE 5 MG PO TABS: 7.5000 mg | ORAL_TABLET | Freq: Every day | ORAL | 3 refills | Status: DC

## 2023-01-25 MED ORDER — CARVEDILOL 25 MG PO TABS: 25.0000 mg | ORAL_TABLET | Freq: Two times a day (BID) | ORAL | 3 refills | Status: DC

## 2023-01-25 NOTE — Patient Instructions (Signed)
Medication Instructions:  Your physician recommends the following medication changes.  INCREASE: Amlodipine 7.5 mg daily  *If you need a refill on your cardiac medications before your next appointment, please call your pharmacy*  Lab Work: None ordered  Follow-Up: At [facility], you and your health needs are our priority.  As part of our continuing mission to provide you with exceptional heart care, we have created designated Provider Care Teams.  These Care Teams include your primary Cardiologist (physician) and Advanced Practice Providers (APPs -  Physician Assistants and Nurse Practitioners) who all work together to provide you with the care you need, when you need it.  We recommend signing up for the patient portal called "MyChart".  Sign up information is provided on this After Visit Summary.  MyChart is used to connect with patients for Virtual Visits (Telemedicine).  Patients are able to view lab/test results, encounter notes, upcoming appointments, etc.  Non-urgent messages can be sent to your provider as well.   To learn more about what you can do with MyChart, go to [email or web address].    Your next appointment:   6 month(s)  Provider:   You will see one of the following Advanced Practice Providers on your designated Care Team:   Eula Listen, [place]

## 2023-03-26 ENCOUNTER — Other Ambulatory Visit: Payer: Self-pay | Admitting: Physician Assistant

## 2023-03-28 NOTE — Telephone Encounter (Signed)
 last office visit: 01/25/23 with plan to f/u in 6 months. next office visit: 07/20/23  Carvedilol new dose rx sent on 01/25/23

## 2023-07-18 NOTE — Progress Notes (Unsigned)
 Cardiology Office Note    Date:  07/20/2023   ID:  Allani L Germer, DOB 31-Oct-1960, MRN [id number]  PCP:  [facility]  Cardiologist:  Antionette Kirks, MD  Electrophysiologist:  None   Chief Complaint: Follow-up  History of Present Illness:   Mindy Myers is a 63 y.o. female with history of hypertensive heart disease with chronic diastolic CHF, HLD, osteoarthritis affecting the bilateral hands, COVID-19 infection in 08/2018, and obesity who presents for follow up of hypertensive heart disease and HFpEF.   She was previously evaluated in 2013 for atypical chest pain, ruled out and underwent nuclear stress testing that was without evidence of ischemia. She reported being diagnosed with HTN years ago and had reportedly been managed with 3 antihypertensives. She had been out of these medications for several months secondary to finances prior to her admission in early 03/2017. She was admitted to [facility] in 03/2017 for chest pain and accelerated HTN with an initial BP of 202/86. Cardiac enzymes negative, BNP 83, EKG with sinus rhythm with LVH and repolarization abnormalities. Echo 03/28/17 showed EF of 50-55%, no RWMA, Gr1DD, mildly dilated left atrium, PASP could not be estimated. Lexiscan  MPI in 03/2017 showed a small defect of mild severity present in the apex location felt to be secondary to breast attenuation, EF 45-54%, felt to be in the setting of hypertensive heart disease. This was a normal study. She was started on Lipitor, Coreg , irbesartan , and Lasix . Since her admission, she has done well and has been keeping a detailed food diary with strict sodium restriction noted. She was diagnosed with COVID-19 in 08/2018 and did not require hospital admission. Following this diagnosis she noted exertional dyspnea with subsequent D-dimer being elevated. CTA of the chest in 09/2018 was negative for PE. Lower extremity ultrasound was negative for DVT bilaterally. CRP and sed rate were normal. Echo in  10/2018 showed an EF of 50-55%, diastolic dysfunction, mildly dilated LA, and mild mitral regurgitation.  She was seen in the office in 08/2022 and was under increased stress due to financial constraints.  In this setting, she was finding it difficult to obtain food low in sodium.  She was referred to our social work team, who have provided assistance.  She was working with her pharmacy to try and obtain a reduced cost of medication.     She was evaluated at a local urgent care on 11/16/2022 after noticing pain and swelling in the left ankle and foot without known trauma.  Upon arriving there, there was concern for lower extremity swelling with the left being greater than the right.  The patient was without symptoms of angina or cardiac decompensation.  It was advised that she hold HCTZ and take furosemide  for 5 days.  Subsequent lower extremity ultrasound was negative for DVT bilaterally on 11/17/2022.   She was seen in the office on 11/25/2022 and did notice some improvement in lower extremity swelling with Ace wrap's and while on furosemide .  In the setting of lower extremity swelling, we discontinued her verapamil  and titrated carvedilol  to 25 mg twice daily with continuation of lisinopril  40 mg and HCTZ 12.5 mg, with subsequent titration of hydrochlorothiazide  to 25 mg.  She was restarted on amlodipine  5 mg in late 11/2022.  Echo in 11/2022 showed an EF of 55%, no regional wall motion abnormalities, normal LV diastolic function parameters, normal RV systolic function and ventricular cavity size, trivial mitral regurgitation with degenerative mitral valve, tricuspid aortic valve, and an estimated right  atrial pressure of 3 mmHg.  She was last seen in the office in 01/2023, noting improvement in lower extremity swelling.  She was wearing compression socks.  Blood pressures have been reasonably controlled in the 1-teens to 130s mmHg systolic.  Amlodipine  was titrated to 7.5 mg with continuation of carvedilol  25 mg  twice daily, HCTZ 25 mg, and lisinopril  40 mg.  She comes in continuing to do well from a cardiac perspective and remains without symptoms of angina or cardiac decompensation.  Blood pressures at home are largely in the low 100s with occasional readings in the 90s systolic.  With these lower readings that she does notice some fatigue.  Given this, she has reduced her amlodipine  from 7.5 to 5 mg daily.  She continues to monitor her sodium intake very closely and is typically below 2000 mg.  No falls or symptoms concerning for bleeding.  No presyncope or syncope.  No significant lower extremity swelling or progressive orthopnea.  Overall feels well from a cardiac perspective.  Only concern at this time is lower BP readings.   Labs independently reviewed: 11/2022 - BUN 15, serum creatinine 0.9, potassium 4.5 07/2021 - TSH normal, albumin 4.0, AST/ALT normal, direct LDL 95, TC 163, TG 155, HDL 41 12/2017 - Hgb 13.0, PLT 391 03/2017 - A1c 5.4  Past Medical History:  Diagnosis Date   BMI 40.0-44.9, adult (HCC) 05/24/2019   CHF (congestive heart failure) (HCC) 03/27/2017   Chronic diastolic CHF (congestive heart failure) (HCC)    a. TTE 1/19: EF of 50-55%, no RWMA, Gr1DD, mildly dilated left atrium, PASP could not be estimated   COVID-19 08/2018   HLD (hyperlipidemia)    HTN (hypertension) 04/05/2017   Hypertensive heart disease    a. TTE 1/19: EF 50-55%, no RWMA, Gr1DD, mildly dilated left atrium, PASP could not be estimated; b. MV 1/19: small defect of mild severity present in the apex location felt to be 2/2 breast attenuation, EF 45-54% felt to be 2/2 hypertensive heart disease. This was a normal study   Obesity    Strain of knee 12/04/2018    Past Surgical History:  Procedure Laterality Date   HAND SURGERY Left     Current Medications: Current Meds  Medication Sig   aspirin  EC (ASPIRIN  LOW DOSE) 81 MG tablet TAKE 1 TABLET (81 MG TOTAL) BY MOUTH DAILY. SWALLOW WHOLE   atorvastatin  (LIPITOR) 40  MG tablet TAKE 1 TABLET BY MOUTH EVERY DAY   carvedilol  (COREG ) 25 MG tablet Take 1 tablet (25 mg total) by mouth 2 (two) times daily.   hydrochlorothiazide  (HYDRODIURIL ) 25 MG tablet Take 1 tablet (25 mg total) by mouth daily.   lisinopril  (ZESTRIL ) 40 MG tablet TAKE 1 TABLET BY MOUTH EVERY DAY   [DISCONTINUED] amLODipine  (NORVASC ) 5 MG tablet Take 1.5 tablets (7.5 mg total) by mouth daily.    Allergies:   Amoxicillin   Social History   Socioeconomic History   Marital status: Single    Spouse name: Not on file   Number of children: 2   Years of education: 12   Highest education level: 12th grade  Occupational History   Not on file  Tobacco Use   Smoking status: Never   Smokeless tobacco: Never  Vaping Use   Vaping status: Never Used  Substance and Sexual Activity   Alcohol use: Yes    Comment: 1 glass of wine every 6 months   Drug use: No   Sexual activity: Never  Other Topics Concern  Not on file  Social History Narrative   Not on file   Social Drivers of Health   Financial Resource Strain: Medium Risk (09/16/2022)   Overall Financial Resource Strain (CARDIA)    Difficulty of Paying Living Expenses: Somewhat hard  Food Insecurity: Food Insecurity Present (09/16/2022)   Hunger Vital Sign    Worried About Running Out of Food in the Last Year: Sometimes true    Ran Out of Food in the Last Year: Sometimes true  Transportation Needs: No Transportation Needs (09/16/2022)   PRAPARE - Administrator, Civil Service (Medical): No    Lack of Transportation (Non-Medical): No  Physical Activity: Inactive (04/05/2017)   Exercise Vital Sign    Days of Exercise per Week: 0 days    Minutes of Exercise per Session: 0 min  Stress: Stress Concern Present (04/05/2017)   [employer] of Occupational Health - Occupational Stress Questionnaire    Feeling of Stress : To some extent  Social Connections: Moderately Isolated (04/05/2017)   Social Connection and Isolation Panel  [NHANES]    Frequency of Communication with Friends and Family: More than three times a week    Frequency of Social Gatherings with Friends and Family: Never    Attends Religious Services: Never    Database administrator or Organizations: No    Attends Engineer, structural: Never    Marital Status: Divorced     Family History:  The patient's family history includes Cancer in her father; Hypertension in her father and mother.  ROS:   12-point review of systems is negative unless otherwise noted in the HPI.   EKGs/Labs/Other Studies Reviewed:    Studies reviewed were summarized above. The additional studies were reviewed today:  Lexiscan  MPI 03/2017: There was no ST segment deviation noted during stress. No T wave inversion was noted during stress. Defect 1: There is a small defect of mild severity present in the apex location. This is likely due to breast attenuation The study is normal. This is a low risk study. The left ventricular ejection fraction is mildly decreased (45-54%). Mildly reduced EF likely due to hypertensive heart disease __________   2D echo 03/2017: - Left ventricle: The cavity size was mildly dilated. There was    moderate concentric hypertrophy. Systolic function was normal.    The estimated ejection fraction was in the range of 50% to 55%.    Wall motion was normal; there were no regional wall motion    abnormalities. Doppler parameters are consistent with abnormal    left ventricular relaxation (grade 1 diastolic dysfunction).  - Left atrium: The atrium was mildly dilated.  - Pulmonary arteries: Systolic pressure could not be accurately    estimated. __________   2D echo 10/2018: 1. The left ventricle has low normal systolic function, with an ejection fraction of 50-55%. The cavity size was normal. Left ventricular diastolic Doppler parameters are consistent with impaired relaxation.  2. The right ventricle has normal systolic function. The  cavity was normal. There is no increase in right ventricular wall thickness. Unable to estimate RVSP.  3. Left atrial size was mildly dilated. __________   2D echo 12/20/2022: 1. Left ventricular ejection fraction, by estimation, is 55%. The left  ventricle has normal function. The left ventricle has no regional wall  motion abnormalities. Left ventricular diastolic parameters were normal.  The average left ventricular global  longitudinal strain is -19.7 %. The global longitudinal strain is normal.   2. Right  ventricular systolic function is normal. The right ventricular  size is normal.   3. The mitral valve is degenerative. Trivial mitral valve regurgitation.   4. The aortic valve is tricuspid. Aortic valve regurgitation is not  visualized.   5. The inferior vena cava is normal in size with greater than 50%  respiratory variability, suggesting right atrial pressure of 3 mmHg.    EKG:  EKG is ordered today.  The EKG ordered today demonstrates NSR, 61 bpm, no acute st/t changes  Recent Labs: 12/14/2022: BUN 15; Creatinine, Ser 0.90; Potassium 4.5; Sodium 140  Recent Lipid Panel    Component Value Date/Time   CHOL 163 08/12/2021 1429   CHOL 146 08/27/2020 1403   TRIG 155 (H) 08/12/2021 1429   HDL 41 08/12/2021 1429   HDL 38 (L) 08/27/2020 1403   CHOLHDL 4.0 08/12/2021 1429   VLDL 31 08/12/2021 1429   LDLCALC 91 08/12/2021 1429   LDLCALC 84 08/27/2020 1403   LDLDIRECT 95.0 08/12/2021 1429    PHYSICAL EXAM:    VS:  BP 118/80 (BP Location: Left Arm)   Pulse 61   Ht 5\' 9"  (1.753 m)   Wt 285 lb (129.3 kg)   SpO2 95%   BMI 42.09 kg/m   BMI: Body mass index is 42.09 kg/m.  Physical Exam Vitals reviewed.  Constitutional:      Appearance: She is well-developed.  HENT:     Head: Normocephalic and atraumatic.  Eyes:     General:        Right eye: No discharge.        Left eye: No discharge.  Cardiovascular:     Rate and Rhythm: Normal rate and regular rhythm.     Heart  sounds: Normal heart sounds, S1 normal and S2 normal. Heart sounds not distant. No midsystolic click and no opening snap. No murmur heard.    No friction rub.  Pulmonary:     Effort: Pulmonary effort is normal. No respiratory distress.     Breath sounds: Normal breath sounds. No decreased breath sounds, wheezing, rhonchi or rales.  Chest:     Chest wall: No tenderness.  Musculoskeletal:     Cervical back: Normal range of motion.  Skin:    General: Skin is warm and dry.     Nails: There is no clubbing.  Neurological:     Mental Status: She is alert and oriented to person, place, and time.  Psychiatric:        Speech: Speech normal.        Behavior: Behavior normal.        Thought Content: Thought content normal.        Judgment: Judgment normal.     Wt Readings from Last 3 Encounters:  07/20/23 285 lb (129.3 kg)  01/25/23 281 lb 9.6 oz (127.7 kg)  11/25/22 285 lb 4 oz (129.4 kg)     ASSESSMENT & PLAN:   Hypertensive heart disease: Blood pressure is well-controlled in the office today.  However, blood pressures at home are largely in the low 100s and occasionally in the 90s systolic.  With this she notes some increased fatigue.  Further reduce amlodipine  to 2.5 mg daily.  Continue carvedilol  25 mg twice daily, HCTZ 25 mg, and lisinopril  40 mg.  Continued low-sodium diet recommended.  Check BMP.   Diastolic dysfunction: Normal diastolic function noted on most recent echo.  Continued optimal blood pressure control.  HLD: LDL 95 in 07/2021 with normal AST/ALT at that time.  Check LFT, lipid panel, and direct LDL.  She remains on atorvastatin  40 mg.  Obesity: Weight loss is encouraged through heart healthy diet and regular exercise.   Disposition: F/u with me in 6 months.   Medication Adjustments/Labs and Tests Ordered: Current medicines are reviewed at length with the patient today.  Concerns regarding medicines are outlined above. Medication changes, Labs and Tests ordered today  are summarized above and listed in the Patient Instructions accessible in Encounters.   Signed, Varney Gentleman, PA-C 07/20/2023 4:10 PM      [facility] [address] [place], [place] [address] [phone]

## 2023-07-20 ENCOUNTER — Encounter: Payer: Self-pay | Admitting: Physician Assistant

## 2023-07-20 ENCOUNTER — Ambulatory Visit: Payer: BC Managed Care – PPO | Attending: Physician Assistant | Admitting: Physician Assistant

## 2023-07-20 VITALS — BP 118/80 | HR 61 | Ht 69.0 in | Wt 285.0 lb

## 2023-07-20 DIAGNOSIS — E66813 Obesity, class 3: Secondary | ICD-10-CM

## 2023-07-20 DIAGNOSIS — I119 Hypertensive heart disease without heart failure: Secondary | ICD-10-CM | POA: Diagnosis not present

## 2023-07-20 DIAGNOSIS — Z79899 Other long term (current) drug therapy: Secondary | ICD-10-CM

## 2023-07-20 DIAGNOSIS — E785 Hyperlipidemia, unspecified: Secondary | ICD-10-CM | POA: Diagnosis not present

## 2023-07-20 DIAGNOSIS — Z6841 Body Mass Index (BMI) 40.0 and over, adult: Secondary | ICD-10-CM

## 2023-07-20 DIAGNOSIS — I5189 Other ill-defined heart diseases: Secondary | ICD-10-CM

## 2023-07-20 MED ORDER — AMLODIPINE BESYLATE 2.5 MG PO TABS: 2.5000 mg | ORAL_TABLET | Freq: Every day | ORAL | 3 refills | Status: DC

## 2023-07-20 NOTE — Patient Instructions (Signed)
 Medication Instructions:  Your physician recommends the following medication changes.  DECREASE: Amlodipine  2.5 mg once daily   *If you need a refill on your cardiac medications before your next appointment, please call your pharmacy*  Lab Work:Medication Instructions:   *If you need a refill on your cardiac medications before your next appointment, please call your pharmacy*  Lab Work: CMet, Lipid, Direct LDL If you have labs (blood work) drawn today and your tests are completely normal, you will receive your results only by: MyChart Message (if you have MyChart) OR A paper copy in the mail If you have any lab test that is abnormal or we need to change your treatment, we will call you to review the results.  Follow-Up: At [facility], you and your health needs are our priority.  As part of our continuing mission to provide you with exceptional heart care, our providers are all part of one team.  This team includes your primary Cardiologist (physician) and Advanced Practice Providers or APPs (Physician Assistants and Nurse Practitioners) who all work together to provide you with the care you need, when you need it.  Your next appointment:   6 month(s)  Provider:   You may see Antionette Kirks, MD or one of the following Advanced Practice Providers on your designated Care Team:   Laneta Pintos, NP Gildardo Labrador, PA-C Varney Gentleman, PA-C Cadence [place], PA-C Ronald Cockayne, NP Morey Ar, NP    We recommend signing up for the patient portal called "MyChart".  Sign up information is provided on this After Visit Summary.  MyChart is used to connect with patients for Virtual Visits (Telemedicine).  Patients are able to view lab/test results, encounter notes, upcoming appointments, etc.  Non-urgent messages can be sent to your provider as well.   To learn more about what you can do with MyChart, go to [email or web address].   I

## 2023-07-21 LAB — COMPREHENSIVE METABOLIC PANEL WITH GFR
ALT: 12 IU/L (ref 0–32)
AST: 14 IU/L (ref 0–40)
Albumin: 4.1 g/dL (ref 3.9–4.9)
Alkaline Phosphatase: 119 IU/L (ref 44–121)
BUN/Creatinine Ratio: 15 (ref 12–28)
BUN: 18 mg/dL (ref 8–27)
Bilirubin Total: 0.4 mg/dL (ref 0.0–1.2)
CO2: 23 mmol/L (ref 20–29)
Calcium: 8.9 mg/dL (ref 8.7–10.3)
Chloride: 100 mmol/L (ref 96–106)
Creatinine, Ser: 1.24 mg/dL — ABNORMAL HIGH (ref 0.57–1.00)
Globulin, Total: 2.5 g/dL (ref 1.5–4.5)
Glucose: 116 mg/dL — ABNORMAL HIGH (ref 70–99)
Potassium: 4.4 mmol/L (ref 3.5–5.2)
Sodium: 141 mmol/L (ref 134–144)
Total Protein: 6.6 g/dL (ref 6.0–8.5)
eGFR: 49 mL/min/{1.73_m2} — ABNORMAL LOW (ref >59)

## 2023-07-21 LAB — LIPID PANEL
Chol/HDL Ratio: 3.5 ratio (ref 0.0–4.4)
Cholesterol, Total: 132 mg/dL (ref 100–199)
HDL: 38 mg/dL — ABNORMAL LOW (ref >39)
LDL Chol Calc (NIH): 67 mg/dL (ref 0–99)
Triglycerides: 160 mg/dL — ABNORMAL HIGH (ref 0–149)
VLDL Cholesterol Cal: 27 mg/dL (ref 5–40)

## 2023-07-21 LAB — LDL CHOLESTEROL, DIRECT: LDL Direct: 70 mg/dL (ref 0–99)

## 2023-08-13 ENCOUNTER — Other Ambulatory Visit: Payer: Self-pay | Admitting: Physician Assistant

## 2023-09-22 ENCOUNTER — Other Ambulatory Visit: Payer: Self-pay | Admitting: Cardiovascular Disease

## 2023-11-13 ENCOUNTER — Other Ambulatory Visit: Payer: Self-pay | Admitting: Physician Assistant

## 2023-11-13 ENCOUNTER — Other Ambulatory Visit: Payer: Self-pay | Admitting: Cardiovascular Disease

## 2023-11-14 ENCOUNTER — Other Ambulatory Visit: Payer: Self-pay | Admitting: *Deleted

## 2023-11-14 MED ORDER — ASPIRIN 81 MG PO TBEC
DELAYED_RELEASE_TABLET | ORAL | 3 refills | Status: AC
Start: 2023-11-14 — End: ?

## 2023-11-14 MED ORDER — LISINOPRIL 40 MG PO TABS: 40.0000 mg | ORAL_TABLET | Freq: Every day | ORAL | 3 refills | Status: DC

## 2024-02-11 ENCOUNTER — Other Ambulatory Visit: Payer: Self-pay | Admitting: Physician Assistant

## 2024-03-01 NOTE — Progress Notes (Signed)
 Cardiology Office Note    Date:  03/06/2024   ID:  Mindy Myers, DOB 1960/05/09, MRN [id number]  PCP:  Mindy Myers  Cardiologist:  Deatrice Cage, MD  Electrophysiologist:  None   Chief Complaint: Follow-up  History of Present Illness:   Mindy Myers is a 63 y.o. female with history of hypertensive heart disease with chronic diastolic CHF, HLD, osteoarthritis affecting the bilateral hands, and obesity who presents for follow up for HTN.  She was previously evaluated in 2013 for atypical chest pain, ruled out and underwent nuclear stress testing that was without evidence of ischemia. She reported being diagnosed with HTN years ago and had reportedly been managed with 3 antihypertensives. She had been out of these medications for several months secondary to finances prior to her admission in early 03/2017. She was admitted to [facility] in 03/2017 for chest pain and accelerated HTN with an initial BP of 202/86. Cardiac enzymes negative, BNP 83, EKG with sinus rhythm with LVH and repolarization abnormalities. Echo 03/28/17 showed EF of 50-55%, no RWMA, Gr1DD, mildly dilated left atrium, PASP could not be estimated. Lexiscan  MPI in 03/2017 showed a small defect of mild severity present in the apex location felt to be secondary to breast attenuation, EF 45-54%, felt to be in the setting of hypertensive heart disease. This was a normal study. She was started on Lipitor, Coreg , irbesartan , and Lasix . Since her admission, she has done well and has been keeping a detailed food diary with strict sodium restriction noted. She was diagnosed with COVID-19 in 08/2018 and did not require hospital admission. Following this diagnosis she noted exertional dyspnea with subsequent D-dimer being elevated. CTA of the chest in 09/2018 was negative for PE. Lower extremity ultrasound was negative for DVT bilaterally. CRP and sed rate were normal. Echo in 10/2018 showed an EF of 50-55%, diastolic dysfunction, mildly  dilated LA, and mild mitral regurgitation.  She was seen in the office in 08/2022 and was under increased stress due to financial constraints.  In this setting, she was finding it difficult to obtain food low in sodium.  She was referred to our social work team, who have provided assistance.  She was working with her pharmacy to try and obtain a reduced cost of medication.     She was evaluated at a local urgent care on 11/16/2022 after noticing pain and swelling in the left ankle and foot without known trauma.  Upon arriving there, there was concern for lower extremity swelling with the left being greater than the right.  The patient was without symptoms of angina or cardiac decompensation.  It was advised that she hold HCTZ and take furosemide  for 5 days.  Subsequent lower extremity ultrasound was negative for DVT bilaterally on 11/17/2022.   She was seen in the office on 11/25/2022 and did notice some improvement in lower extremity swelling with Ace wrap's and while on furosemide .  In the setting of lower extremity swelling, we discontinued her verapamil  and titrated carvedilol  to 25 mg twice daily with continuation of lisinopril  40 mg and HCTZ 12.5 mg, with subsequent titration of hydrochlorothiazide  to 25 mg.  She was restarted on amlodipine  5 mg in late 11/2022.  Echo in 11/2022 showed an EF of 55%, no regional wall motion abnormalities, normal LV diastolic function parameters, normal RV systolic function and ventricular cavity size, trivial mitral regurgitation with degenerative mitral valve, tricuspid aortic valve, and an estimated right atrial pressure of 3 mmHg.  She was seen  in the office in 01/2023, noting improvement in lower extremity swelling.  She was wearing compression socks.  Blood pressures had been reasonably controlled in the 1-teens to 130s mmHg systolic.  Amlodipine  was titrated to 7.5 mg with continuation of carvedilol  25 mg twice daily, HCTZ 25 mg, and lisinopril  40 mg.  She was last seen in  the office in 06/2023 noting BPs in the 90s to low 100s mmHg systolic at times.  She had reduced amlodipine  from 7.5 mg to 5 mg.  She continued to follow a low-sodium diet.  Amlodipine  was further reduced to 2.5 mg continuation of Coreg  25 mg twice daily, HCTZ 25 mg, and lisinopril  40 mg.  She comes in doing very well from a cardiac perspective and is without symptoms of angina or cardiac decompensation.  Blood pressures have been well-controlled at home ranging from the 1 teens to mid 120s mmHg systolic.  No further episodes of hypotension.  With BPs 100s to 90s mmHg systolic, fatigue is also improved.  Continues to follow a low-sodium diet.  She does note some mild lower extremity swelling if she has been up on her feet for extended time frames that improves with leg elevation.  No progressive orthopnea.  No dizziness, recent AMI, or syncope.  No falls.  Overall doing well from a cardiac perspective.   Labs independently reviewed: 06/2023 - direct LDL 70, TC 132, TG 160, HDL 38, BUN 18, serum creatinine 1.24, potassium 4.4, albumin 4.1, AST/ALT normal 07/2021 - TSH normal 12/2017 - Hgb 13.0, PLT 391 03/2017 - A1c 5.4  Past Medical History:  Diagnosis Date   BMI 40.0-44.9, adult (HCC) 05/24/2019   CHF (congestive heart failure) (HCC) 03/27/2017   Chronic diastolic CHF (congestive heart failure) (HCC)    a. TTE 1/19: EF of 50-55%, no RWMA, Gr1DD, mildly dilated left atrium, PASP could not be estimated   COVID-19 08/2018   HLD (hyperlipidemia)    HTN (hypertension) 04/05/2017   Hypertensive heart disease    a. TTE 1/19: EF 50-55%, no RWMA, Gr1DD, mildly dilated left atrium, PASP could not be estimated; b. MV 1/19: small defect of mild severity present in the apex location felt to be 2/2 breast attenuation, EF 45-54% felt to be 2/2 hypertensive heart disease. This was a normal study   Obesity    Strain of knee 12/04/2018    Past Surgical History:  Procedure Laterality Date   HAND SURGERY Left      Current Medications: Active Medications[1]  Allergies:   Amoxicillin   Social History   Socioeconomic History   Marital status: Single    Spouse name: Not on file   Number of children: 2   Years of education: 12   Highest education level: 12th grade  Occupational History   Not on file  Tobacco Use   Smoking status: Never   Smokeless tobacco: Never  Vaping Use   Vaping status: Never Used  Substance and Sexual Activity   Alcohol use: Yes    Comment: 1 glass of wine every 6 months   Drug use: No   Sexual activity: Never  Other Topics Concern   Not on file  Social History Narrative   Not on file   Social Drivers of Health   Tobacco Use: Low Risk (03/06/2024)   Patient History    Smoking Tobacco Use: Never    Smokeless Tobacco Use: Never    Passive Exposure: Not on file  Financial Resource Strain: High Risk (08/25/2023)   Received  [facility]   Overall Financial Resource Strain (CARDIA)    Difficulty of Paying Living Expenses: Hard  Food Insecurity: Food Insecurity Present (08/25/2023)   Received from [facility]   Epic    Within the past 12 months, you worried that your food would run out before you got the money to buy more.: Sometimes true    Within the past 12 months, the food you bought just didn't last and you didn't have money to get more.: Sometimes true  Transportation Needs: No Transportation Needs (08/25/2023)   Received from [facility] - Transportation    In the past 12 months, has lack of transportation kept you from medical appointments or from getting medications?: No    Lack of Transportation (Non-Medical): No  Physical Activity: Not on file  Stress: Not on file  Social Connections: Not on file  Depression (EYV7-0): Not on file  Alcohol Screen: Not on file  Housing: Unknown (08/25/2023)   Received from [facility]   Epic    In the last 12 months, was there a time  when you were not able to pay the mortgage or rent on time?: No    Number of Times Moved in the Last Year: Not on file    At any time in the past 12 months, were you homeless or living in a shelter (including now)?: No  Utilities: Not At Risk (08/25/2023)   Received from [facility]    Threatened with loss of utilities: No  Health Literacy: Not on file     Family History:  The patient's family history includes Cancer in her father; Hypertension in her father and mother.  ROS:   12-point review of systems is negative unless otherwise noted in the HPI.   EKGs/Labs/Other Studies Reviewed:    Studies reviewed were summarized above. The additional studies were reviewed today:  Lexiscan  MPI 03/2017: There was no ST segment deviation noted during stress. No T wave inversion was noted during stress. Defect 1: There is a small defect of mild severity present in the apex location. This is likely due to breast attenuation The study is normal. This is a low risk study. The left ventricular ejection fraction is mildly decreased (45-54%). Mildly reduced EF likely due to hypertensive heart disease __________   2D echo 03/2017: - Left ventricle: The cavity size was mildly dilated. There was    moderate concentric hypertrophy. Systolic function was normal.    The estimated ejection fraction was in the range of 50% to 55%.    Wall motion was normal; there were no regional wall motion    abnormalities. Doppler parameters are consistent with abnormal    left ventricular relaxation (grade 1 diastolic dysfunction).  - Left atrium: The atrium was mildly dilated.  - Pulmonary arteries: Systolic pressure could not be accurately    estimated. __________   2D echo 10/2018: 1. The left ventricle has low normal systolic function, with an ejection fraction of 50-55%. The cavity size was normal. Left ventricular diastolic Doppler parameters are consistent with impaired  relaxation.  2. The right ventricle has normal systolic function. The cavity was normal. There is no increase in right ventricular wall thickness. Unable to estimate RVSP.  3. Left atrial size was mildly dilated. __________   2D echo 12/20/2022: 1. Left ventricular ejection fraction, by estimation, is 55%. The left  ventricle has normal function. The  left ventricle has no regional wall  motion abnormalities. Left ventricular diastolic parameters were normal.  The average left ventricular global  longitudinal strain is -19.7 %. The global longitudinal strain is normal.   2. Right ventricular systolic function is normal. The right ventricular  size is normal.   3. The mitral valve is degenerative. Trivial mitral valve regurgitation.   4. The aortic valve is tricuspid. Aortic valve regurgitation is not  visualized.   5. The inferior vena cava is normal in size with greater than 50%  respiratory variability, suggesting right atrial pressure of 3 mmHg.    EKG:  EKG is ordered today.  The EKG ordered today demonstrates NSR, minimal voltage for LVH, no acute ST-T changes  Recent Labs: 07/20/2023: ALT 12; BUN 18; Creatinine, Ser 1.24; Potassium 4.4; Sodium 141  Recent Lipid Panel    Component Value Date/Time   CHOL 132 07/20/2023 1413   TRIG 160 (H) 07/20/2023 1413   HDL 38 (L) 07/20/2023 1413   CHOLHDL 3.5 07/20/2023 1413   CHOLHDL 4.0 08/12/2021 1429   VLDL 31 08/12/2021 1429   LDLCALC 67 07/20/2023 1413   LDLDIRECT 70 07/20/2023 1413   LDLDIRECT 95.0 08/12/2021 1429    PHYSICAL EXAM:    VS:  BP 120/70 (BP Location: Left Arm, Patient Position: Sitting, Cuff Size: Large)   Pulse 62 Comment: 71 oximeter  Ht 5' 9 (1.753 m)   Wt 290 lb 3.2 oz (131.6 kg)   SpO2 97%   BMI 42.86 kg/m   BMI: Body mass index is 42.86 kg/m.  Physical Exam Vitals reviewed.  Constitutional:      Appearance: She is well-developed.  HENT:     Head: Normocephalic and atraumatic.  Eyes:     General:         Right eye: No discharge.        Left eye: No discharge.  Cardiovascular:     Rate and Rhythm: Normal rate and regular rhythm.     Heart sounds: Normal heart sounds, S1 normal and S2 normal. Heart sounds not distant. No midsystolic click and no opening snap. No murmur heard.    No friction rub.  Pulmonary:     Effort: Pulmonary effort is normal. No respiratory distress.     Breath sounds: Normal breath sounds. No decreased breath sounds, wheezing, rhonchi or rales.  Musculoskeletal:     Cervical back: Normal range of motion.  Skin:    General: Skin is warm and dry.     Nails: There is no clubbing.  Neurological:     Mental Status: She is alert and oriented to person, place, and time.  Psychiatric:        Speech: Speech normal.        Behavior: Behavior normal.        Thought Content: Thought content normal.        Judgment: Judgment normal.     Wt Readings from Last 3 Encounters:  03/06/24 290 lb 3.2 oz (131.6 kg)  07/20/23 285 lb (129.3 kg)  01/25/23 281 lb 9.6 oz (127.7 kg)     ASSESSMENT & PLAN:   Hypertensive heart disease: Blood pressure is well-controlled in the office today and at home.  Continue current pharmacotherapy including amlodipine  2.5 mg, carvedilol  25 mg twice daily, HCTZ 25 mg, and lisinopril  40 mg.  Diastolic dysfunction: Normal diastolic function noted on most recent echo.  Continue optimal blood pressure control.  HLD: LDL 70 in 06/2023 with normal AST/ALT at that  time.  She remains on atorvastatin  40 mg.  Obesity: Weight loss recommended with healthy diet and regular exercise.  Will be joining [employer].     Disposition: F/u with Dr. Darron or an APP in 6 months.   Medication Adjustments/Labs and Tests Ordered: Current medicines are reviewed at length with the patient today.  Concerns regarding medicines are outlined above. Medication changes, Labs and Tests ordered today are summarized above and listed in the Patient Instructions  accessible in Encounters.   Signed, Bernardino Bring, PA-C 03/06/2024 5:28 PM     [facility] [place] [address] [place], [place] [address] [phone]     [1]  Current Meds  Medication Sig   aspirin  EC (ASPIRIN  LOW DOSE) 81 MG tablet TAKE 1 TABLET (81 MG TOTAL) BY MOUTH DAILY. SWALLOW WHOLE   [DISCONTINUED] amLODipine  (NORVASC ) 2.5 MG tablet Take 1 tablet (2.5 mg total) by mouth daily.   [DISCONTINUED] atorvastatin  (LIPITOR) 40 MG tablet TAKE 1 TABLET BY MOUTH EVERY DAY   [DISCONTINUED] carvedilol  (COREG ) 25 MG tablet Take 1 tablet (25 mg total) by mouth 2 (two) times daily.   [DISCONTINUED] hydrochlorothiazide  (HYDRODIURIL ) 25 MG tablet Take 1 tablet (25 mg total) by mouth daily.   [DISCONTINUED] lisinopril  (ZESTRIL ) 40 MG tablet Take 1 tablet (40 mg total) by mouth daily.

## 2024-03-06 ENCOUNTER — Encounter: Payer: Self-pay | Admitting: Physician Assistant

## 2024-03-06 ENCOUNTER — Ambulatory Visit: Payer: Self-pay | Attending: Physician Assistant | Admitting: Physician Assistant

## 2024-03-06 VITALS — BP 120/70 | HR 62 | Ht 69.0 in | Wt 290.2 lb

## 2024-03-06 DIAGNOSIS — E785 Hyperlipidemia, unspecified: Secondary | ICD-10-CM

## 2024-03-06 DIAGNOSIS — I5189 Other ill-defined heart diseases: Secondary | ICD-10-CM

## 2024-03-06 DIAGNOSIS — I119 Hypertensive heart disease without heart failure: Secondary | ICD-10-CM

## 2024-03-06 DIAGNOSIS — E66813 Obesity, class 3: Secondary | ICD-10-CM

## 2024-03-06 MED ORDER — LISINOPRIL 40 MG PO TABS: 40.0000 mg | ORAL_TABLET | Freq: Every day | ORAL | 3 refills | Status: AC

## 2024-03-06 MED ORDER — HYDROCHLOROTHIAZIDE 25 MG PO TABS: 25.0000 mg | ORAL_TABLET | Freq: Every day | ORAL | 3 refills | Status: DC

## 2024-03-06 MED ORDER — ATORVASTATIN CALCIUM 40 MG PO TABS: 40.0000 mg | ORAL_TABLET | Freq: Every day | ORAL | 3 refills | Status: AC

## 2024-03-06 MED ORDER — AMLODIPINE BESYLATE 2.5 MG PO TABS: 2.5000 mg | ORAL_TABLET | Freq: Every day | ORAL | 3 refills | Status: AC

## 2024-03-06 MED ORDER — CARVEDILOL 25 MG PO TABS: 25.0000 mg | ORAL_TABLET | Freq: Two times a day (BID) | ORAL | 3 refills | Status: AC

## 2024-03-06 NOTE — Patient Instructions (Addendum)
 Medication Instructions:  Your physician recommends that you continue on your current medications as directed. Please refer to the Current Medication list given to you today.   *If you need a refill on your cardiac medications before your next appointment, please call your pharmacy*  Lab Work: None ordered at this time   Follow-Up: At [facility], you and your health needs are our priority.  As part of our continuing mission to provide you with exceptional heart care, our providers are all part of one team.  This team includes your primary Cardiologist (physician) and Advanced Practice Providers or APPs (Physician Assistants and Nurse Practitioners) who all work together to provide you with the care you need, when you need it.  Your next appointment:   6 month(s)  Provider:   You may see Deatrice Cage, MD or Bernardino Bring, PA-C  We recommend signing up for the patient portal called MyChart.  Sign up information is provided on this After Visit Summary.  MyChart is used to connect with patients for Virtual Visits (Telemedicine).  Patients are able to view lab/test results, encounter notes, upcoming appointments, etc.  Non-urgent messages can be sent to your provider as well.   To learn more about what you can do with MyChart, go to [email or web address].   Other Instructions Medicare form for Heart Failure is expected

## 2024-03-08 ENCOUNTER — Telehealth: Payer: Self-pay | Admitting: Cardiovascular Disease

## 2024-03-08 NOTE — Telephone Encounter (Signed)
 Patient calling to see if the fax was received. Please advise

## 2024-04-06 NOTE — Telephone Encounter (Signed)
 Please see MyChart message.  I have decreased her HCTZ to 12.5 mg daily.  Please place future order for BMP.  Diagnosis hypertension.

## 2024-04-09 ENCOUNTER — Other Ambulatory Visit: Payer: Self-pay | Admitting: Emergency Medicine

## 2024-04-09 DIAGNOSIS — I110 Hypertensive heart disease with heart failure: Secondary | ICD-10-CM

## 2024-04-09 DIAGNOSIS — I10 Essential (primary) hypertension: Secondary | ICD-10-CM

## 2024-04-09 DIAGNOSIS — Z79899 Other long term (current) drug therapy: Secondary | ICD-10-CM

## 2024-04-09 DIAGNOSIS — I119 Hypertensive heart disease without heart failure: Secondary | ICD-10-CM

## 2024-04-09 MED ORDER — HYDROCHLOROTHIAZIDE 12.5 MG PO TABS: 12.5000 mg | ORAL_TABLET | Freq: Every day | ORAL | 3 refills | Status: DC

## 2024-04-10 ENCOUNTER — Other Ambulatory Visit: Payer: Self-pay | Admitting: Emergency Medicine

## 2024-04-10 DIAGNOSIS — I10 Essential (primary) hypertension: Secondary | ICD-10-CM

## 2024-04-10 DIAGNOSIS — I110 Hypertensive heart disease with heart failure: Secondary | ICD-10-CM

## 2024-04-10 DIAGNOSIS — I119 Hypertensive heart disease without heart failure: Secondary | ICD-10-CM

## 2024-04-10 DIAGNOSIS — Z79899 Other long term (current) drug therapy: Secondary | ICD-10-CM

## 2024-04-11 LAB — BASIC METABOLIC PANEL WITH GFR
BUN/Creatinine Ratio: 23 (ref 12–28)
BUN: 30 mg/dL — ABNORMAL HIGH (ref 8–27)
CO2: 25 mmol/L (ref 20–29)
Calcium: 9 mg/dL (ref 8.7–10.3)
Chloride: 101 mmol/L (ref 96–106)
Creatinine, Ser: 1.33 mg/dL — ABNORMAL HIGH (ref 0.57–1.00)
Glucose: 103 mg/dL — ABNORMAL HIGH (ref 70–99)
Potassium: 4.8 mmol/L (ref 3.5–5.2)
Sodium: 142 mmol/L (ref 134–144)
eGFR: 44 mL/min/1.73 — ABNORMAL LOW

## 2024-04-12 ENCOUNTER — Ambulatory Visit: Payer: Self-pay | Admitting: Physician Assistant

## 2024-04-12 DIAGNOSIS — Z79899 Other long term (current) drug therapy: Secondary | ICD-10-CM

## 2024-04-12 DIAGNOSIS — I509 Heart failure, unspecified: Secondary | ICD-10-CM

## 2024-09-10 ENCOUNTER — Ambulatory Visit: Admitting: Physician Assistant
